# Patient Record
Sex: Female | Born: 1985
Health system: Southern US, Community
[De-identification: ages and names within clinical notes are randomized; demographics above are authoritative.]

## PROBLEM LIST (undated history)

## (undated) DIAGNOSIS — J302 Other seasonal allergic rhinitis: Secondary | ICD-10-CM

## (undated) DIAGNOSIS — J45909 Unspecified asthma, uncomplicated: Secondary | ICD-10-CM

## (undated) DIAGNOSIS — I10 Essential (primary) hypertension: Secondary | ICD-10-CM

## (undated) DIAGNOSIS — N390 Urinary tract infection, site not specified: Secondary | ICD-10-CM

## (undated) HISTORY — PX: TUBAL LIGATION: SHX77

---
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
## 2003-08-12 ENCOUNTER — Inpatient Hospital Stay (HOSPITAL_COMMUNITY): Admission: AD | Admit: 2003-08-12 | Discharge: 2003-08-13 | Payer: Self-pay | Admitting: *Deleted

## 2003-12-07 ENCOUNTER — Emergency Department (HOSPITAL_COMMUNITY): Admission: EM | Admit: 2003-12-07 | Discharge: 2003-12-07 | Payer: Self-pay

## 2004-07-22 ENCOUNTER — Inpatient Hospital Stay (HOSPITAL_COMMUNITY): Admission: EM | Admit: 2004-07-22 | Discharge: 2004-07-26 | Payer: Self-pay | Admitting: *Deleted

## 2004-10-11 ENCOUNTER — Ambulatory Visit (HOSPITAL_COMMUNITY): Admission: RE | Admit: 2004-10-11 | Discharge: 2004-10-11 | Payer: Self-pay | Admitting: *Deleted

## 2004-10-31 ENCOUNTER — Ambulatory Visit (HOSPITAL_COMMUNITY): Admission: RE | Admit: 2004-10-31 | Discharge: 2004-10-31 | Payer: Self-pay | Admitting: *Deleted

## 2004-11-01 ENCOUNTER — Ambulatory Visit (HOSPITAL_COMMUNITY): Admission: AD | Admit: 2004-11-01 | Discharge: 2004-11-01 | Payer: Self-pay | Admitting: *Deleted

## 2004-11-14 ENCOUNTER — Inpatient Hospital Stay (HOSPITAL_COMMUNITY): Admission: RE | Admit: 2004-11-14 | Discharge: 2004-11-17 | Payer: Self-pay | Admitting: *Deleted

## 2005-06-23 IMAGING — CR DG CHEST 1V
1 series · 1 of 1 positions shown · non-contrast
Comparison: none

CLINICAL DATA: Abdominal pain and rectal bleeding.  
 CHEST ? ONE VIEW:
 Heart size and vascularity are normal and the lungs are clear.  There is a mild thoracolumbar scoliosis.

[view not recorded]
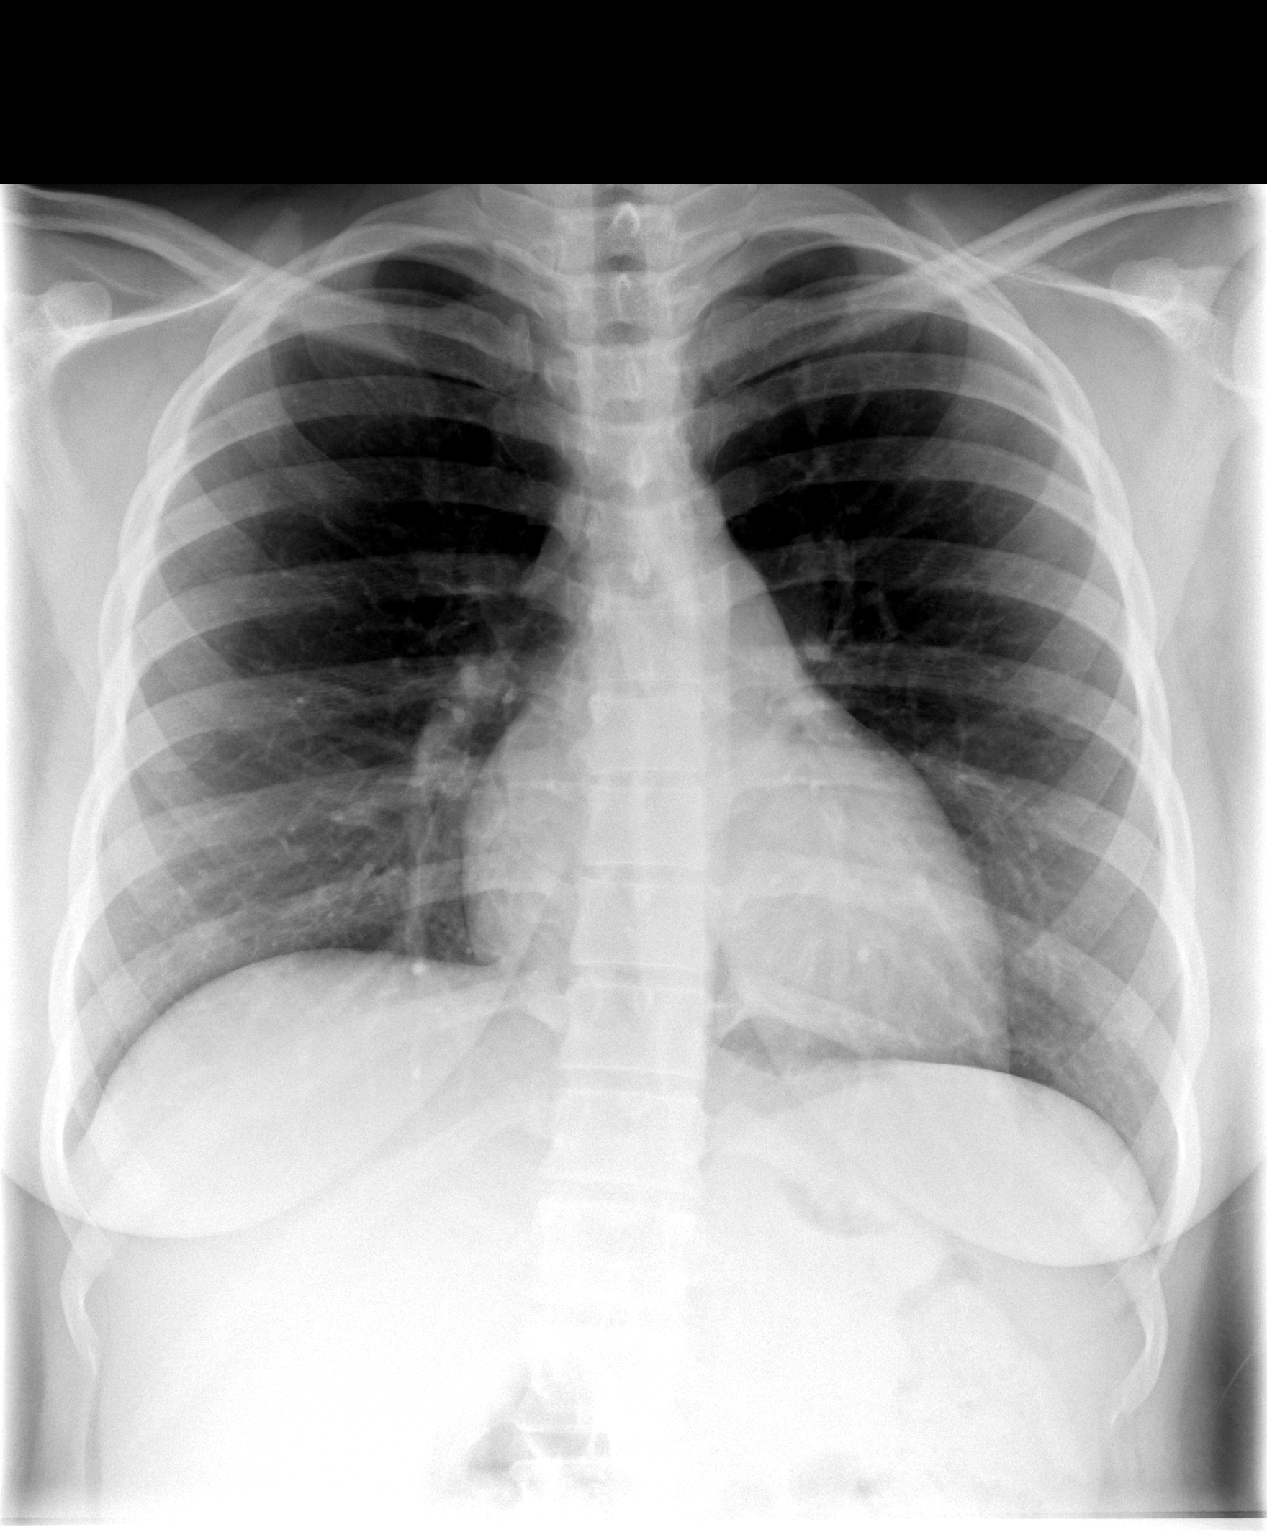

[1 of 1 positions shown; findings below may reference images not displayed]

IMPRESSION: No significant abnormality.

## 2005-06-24 IMAGING — US US OB COMP +14 WK
1 series · 14 of 28 positions shown · non-contrast
Comparison: none

CLINICAL DATA: Secondary syphilis date pregnancy. 
 OBSTETRICAL ULTRASOUND > 14 WEEKS:
 There is a single living intrauterine pregnancy with heart rate of 153 beats per minute and fetal movement. There is cephalic presentation at this time. The placenta is anterior and grade I with no previa. Amniotic fluid volume is normal.  The EPD if 5.6 or 23 weeks, 2 days.  Head circumference is 20.8 or 23 weeks, 0 days.    Abdominal circumference is 17.2 or 23 weeks, 1 day. Femur length is 3.9 or 22 weeks, 4 days. Mean gestational age is 22 weeks, five days.  Ratios are within normal limits.  The fetal intracranial contents, nuchal region, spine, four chamber heart, left side of the stomach, three vessel cord, anterior abdominal wall, both kidneys, bladder, four extremities are noted and appear normal.  The fetus is a male. The cervix is 4 cm.

[Series 1: unknown · 14 of 46 slices shown]
[im 2/46]
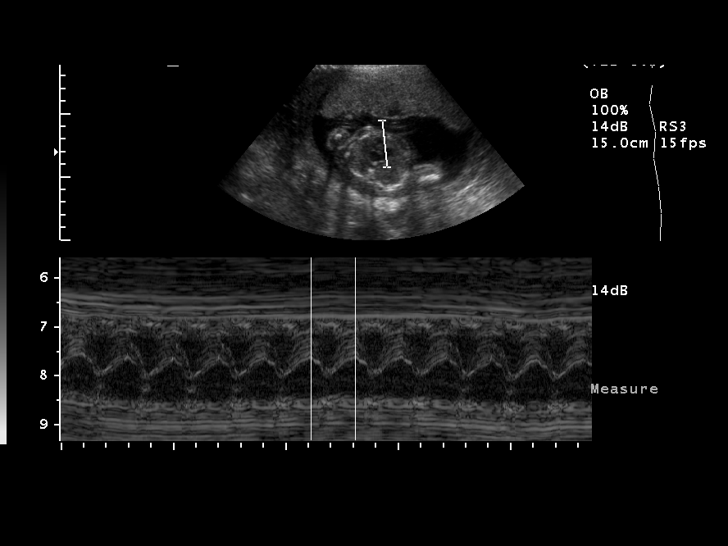
[im 6/46]
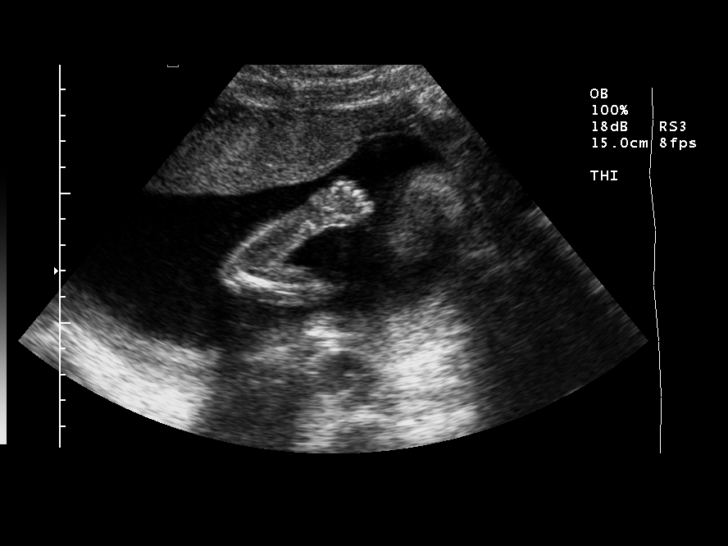
[im 9/46]
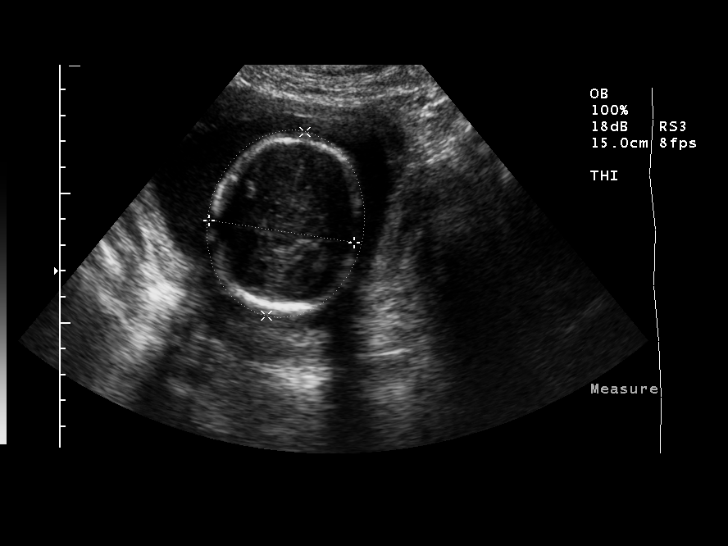
[im 12/46]
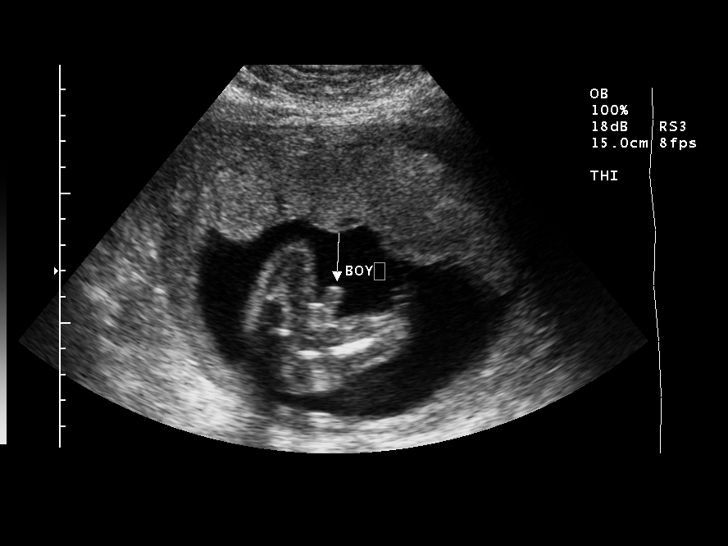
[im 16/46]
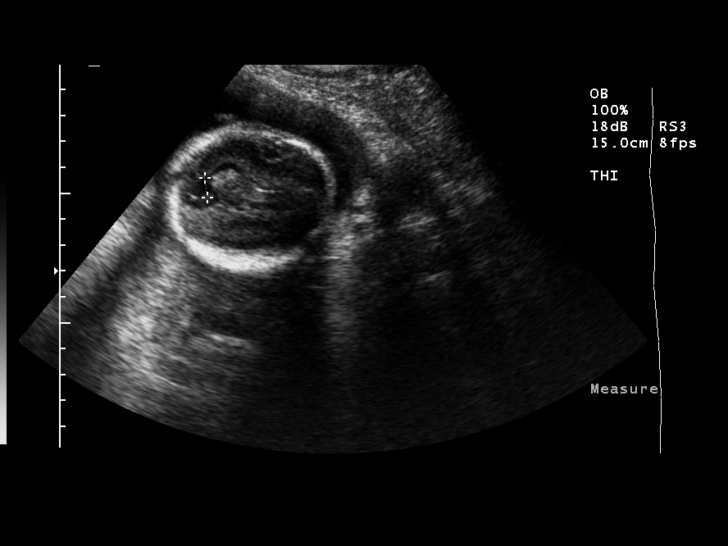
[im 19/46]
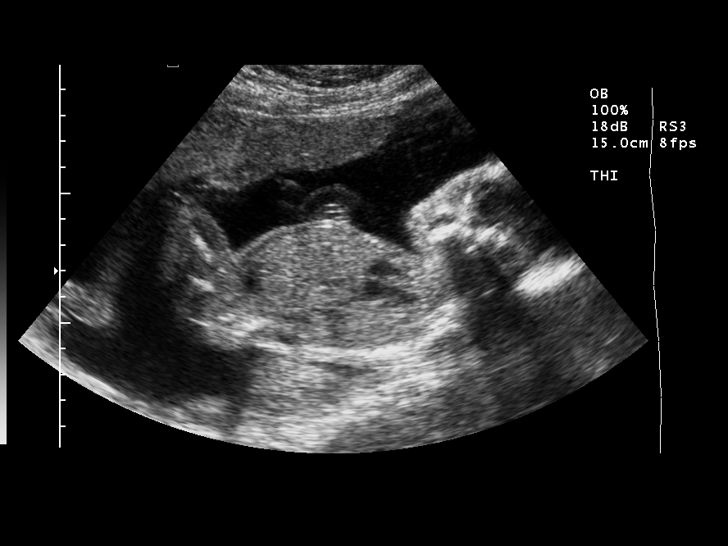
[im 22/46]
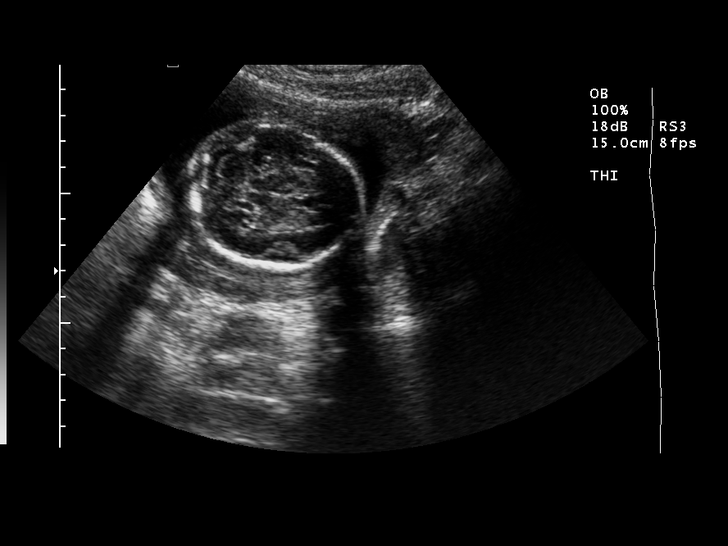
[im 26/46]
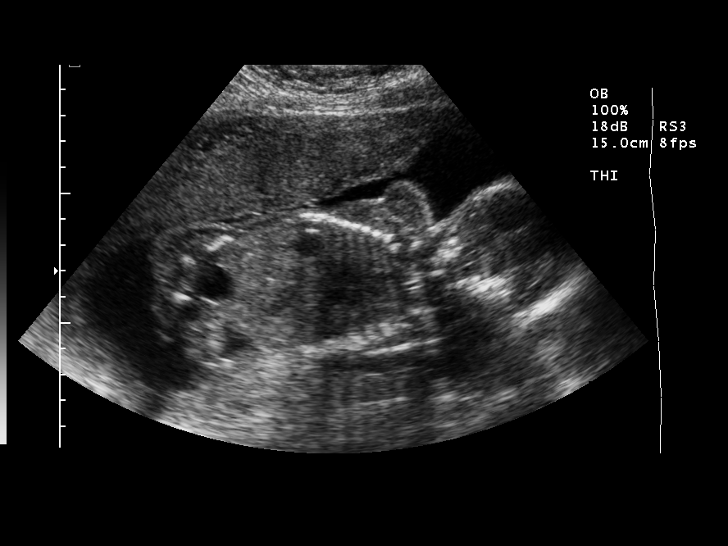
[im 29/46]
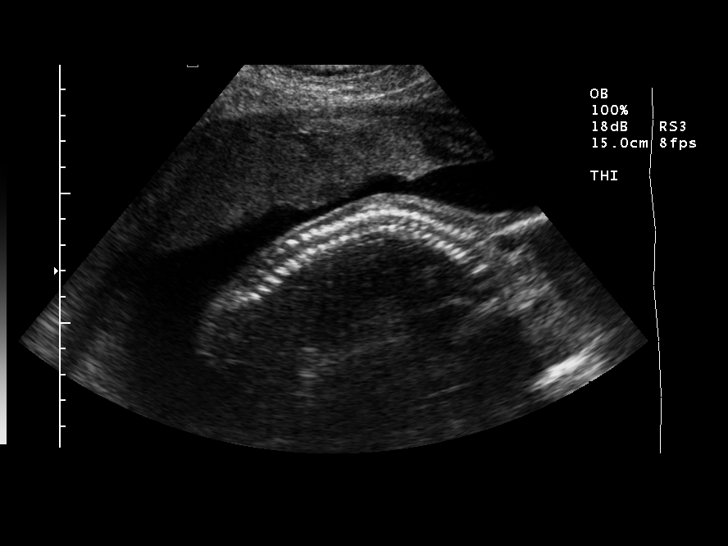
[im 32/46]
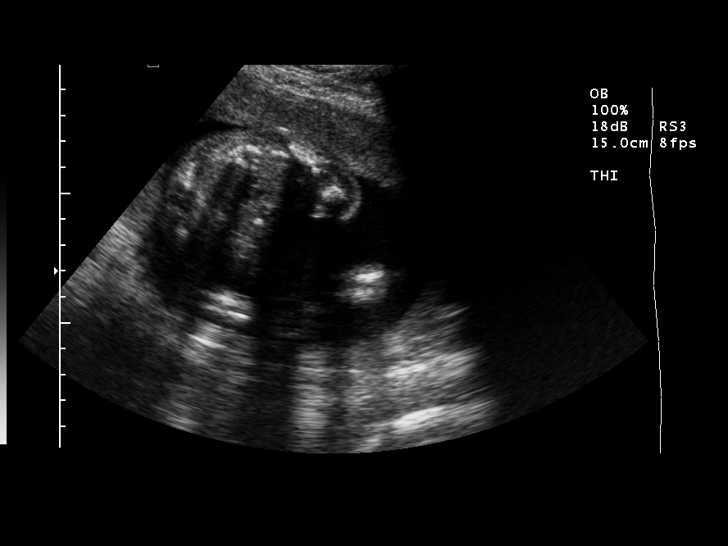
[im 36/46]
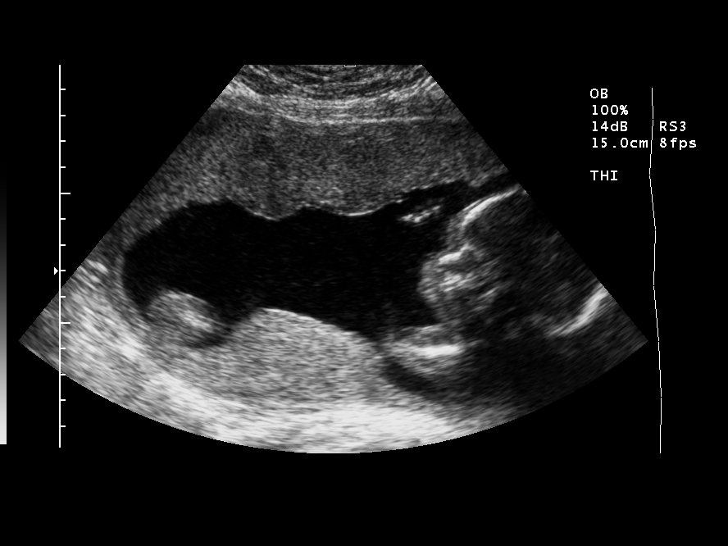
[im 39/46]
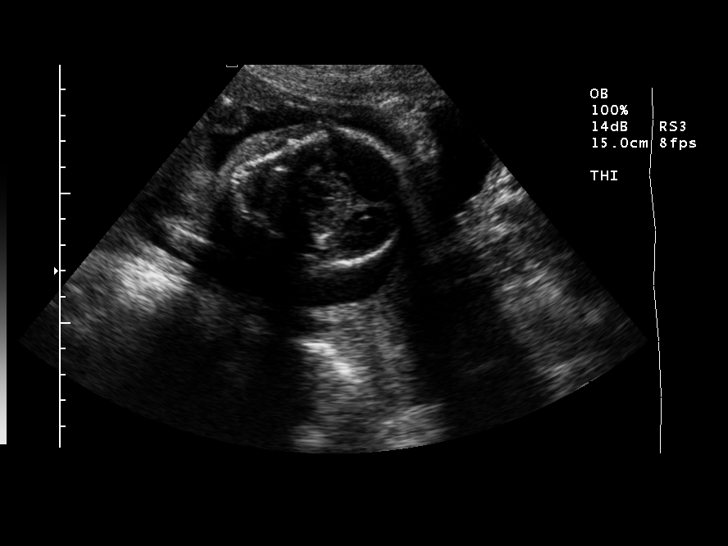
[im 42/46]
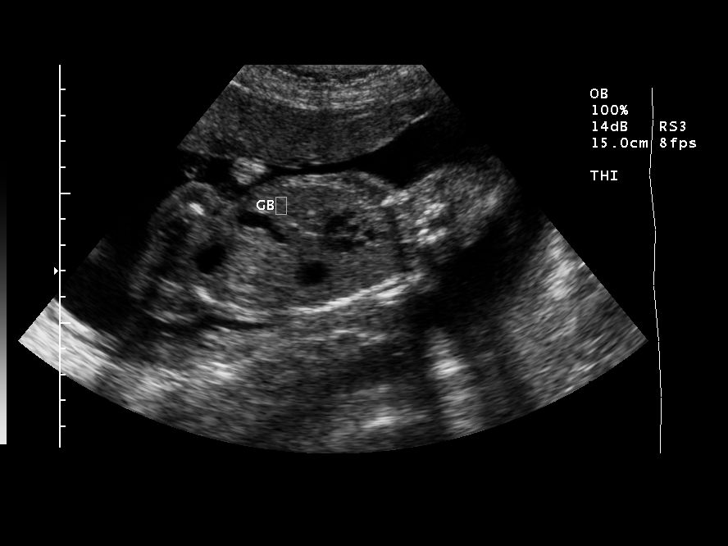
[im 46/46]
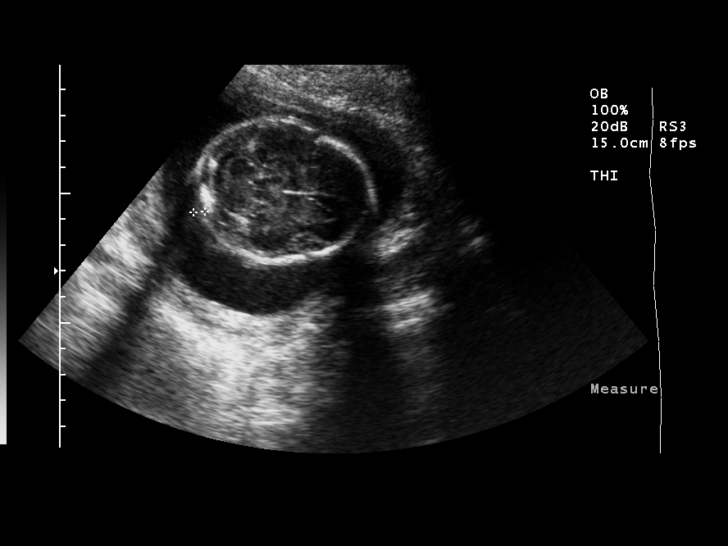

[14 of 28 positions shown; findings below may reference images not displayed]

IMPRESSION: Single living intrauterine pregnancy appearing normal at 22 weeks, 5 days.

## 2008-12-13 ENCOUNTER — Emergency Department (HOSPITAL_COMMUNITY): Admission: EM | Admit: 2008-12-13 | Discharge: 2008-12-13 | Payer: Self-pay | Admitting: Emergency Medicine

## 2009-07-03 ENCOUNTER — Other Ambulatory Visit: Payer: Self-pay | Admitting: Emergency Medicine

## 2009-07-03 ENCOUNTER — Other Ambulatory Visit: Payer: Self-pay | Admitting: Obstetrics and Gynecology

## 2009-07-03 ENCOUNTER — Inpatient Hospital Stay (HOSPITAL_COMMUNITY): Admission: AD | Admit: 2009-07-03 | Discharge: 2009-07-03 | Payer: Self-pay | Admitting: Obstetrics and Gynecology

## 2009-07-15 ENCOUNTER — Ambulatory Visit: Payer: Self-pay | Admitting: Obstetrics and Gynecology

## 2009-07-15 ENCOUNTER — Encounter: Payer: Self-pay | Admitting: Obstetrics and Gynecology

## 2009-07-15 LAB — CONVERTED CEMR LAB
Amphetamine Screen, Ur: NEGATIVE
Antibody Screen: NEGATIVE
Barbiturate Quant, Ur: NEGATIVE
Benzodiazepines.: NEGATIVE
Cocaine Metabolites: NEGATIVE
Creatinine,U: 138.9 mg/dL
Marijuana Metabolite: NEGATIVE
Methadone: NEGATIVE
Opiate Screen, Urine: NEGATIVE
Phencyclidine (PCP): NEGATIVE
Propoxyphene: NEGATIVE

## 2009-07-29 ENCOUNTER — Ambulatory Visit: Payer: Self-pay | Admitting: Obstetrics and Gynecology

## 2009-08-05 ENCOUNTER — Inpatient Hospital Stay (HOSPITAL_COMMUNITY): Admission: AD | Admit: 2009-08-05 | Discharge: 2009-08-07 | Payer: Self-pay | Admitting: Obstetrics & Gynecology

## 2009-08-05 ENCOUNTER — Ambulatory Visit: Payer: Self-pay | Admitting: Family

## 2009-08-05 ENCOUNTER — Encounter: Payer: Self-pay | Admitting: Obstetrics & Gynecology

## 2009-10-08 ENCOUNTER — Ambulatory Visit: Payer: Self-pay | Admitting: Obstetrics and Gynecology

## 2009-11-02 ENCOUNTER — Ambulatory Visit (HOSPITAL_COMMUNITY): Admission: RE | Admit: 2009-11-02 | Discharge: 2009-11-02 | Payer: Self-pay | Admitting: Obstetrics and Gynecology

## 2009-11-02 ENCOUNTER — Ambulatory Visit: Payer: Self-pay | Admitting: Obstetrics and Gynecology

## 2010-06-04 IMAGING — US US OB COMP +14 WK
1 series · 14 of 28 positions shown · non-contrast
Comparison: none

OBSTETRICAL ULTRASOUND:
 This ultrasound exam was performed in the [HOSPITAL] Ultrasound Department.  The OB US report was generated in the AS system, and faxed to the ordering physician.  This report is also available in [HOSPITAL]?s [employer] and in [REDACTED] PACS.

[Series 1: us ob comp +14 wk · 32 acquisitions, 14 frames shown]
[im 2/32]
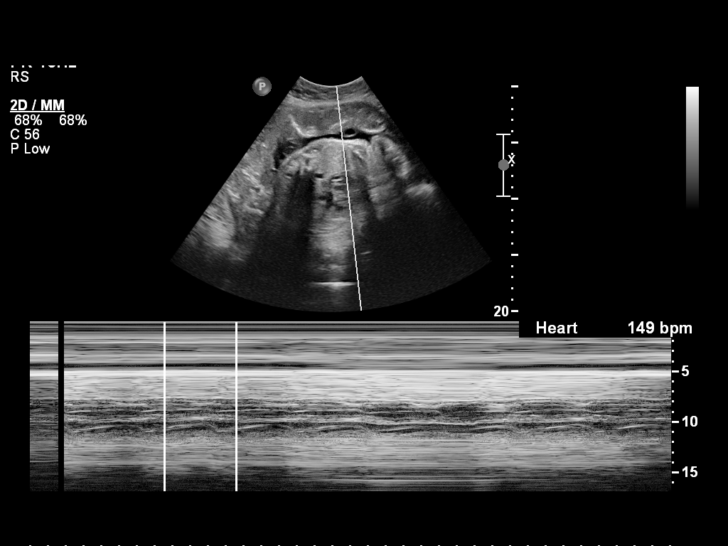
[im 4/32]
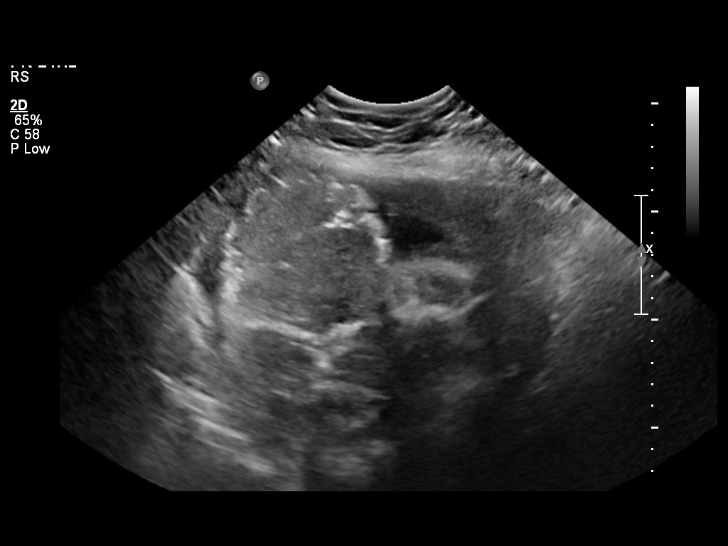
[im 6/32]
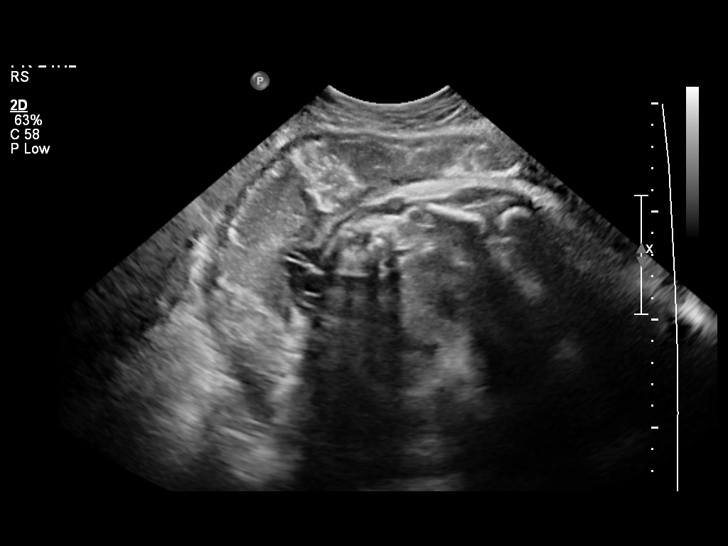
[im 9/32]
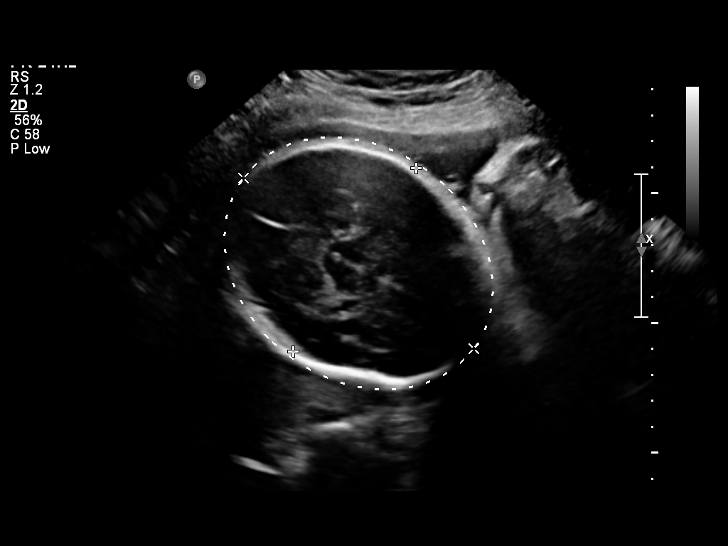
[im 11/32]
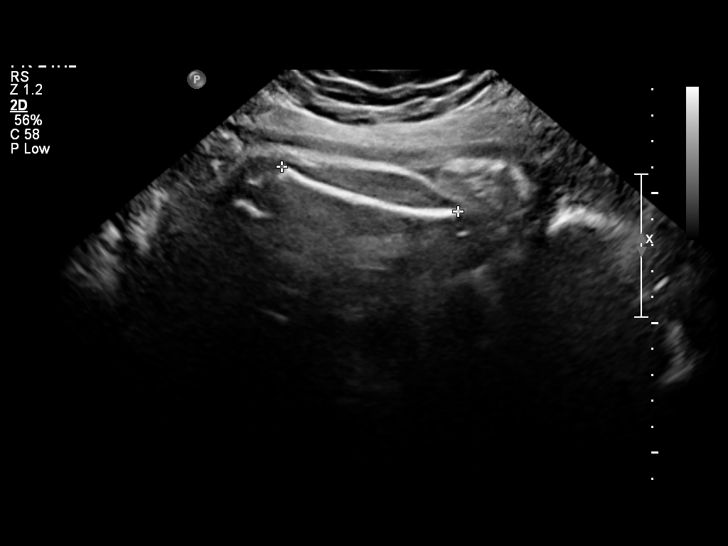
[im 13/32]
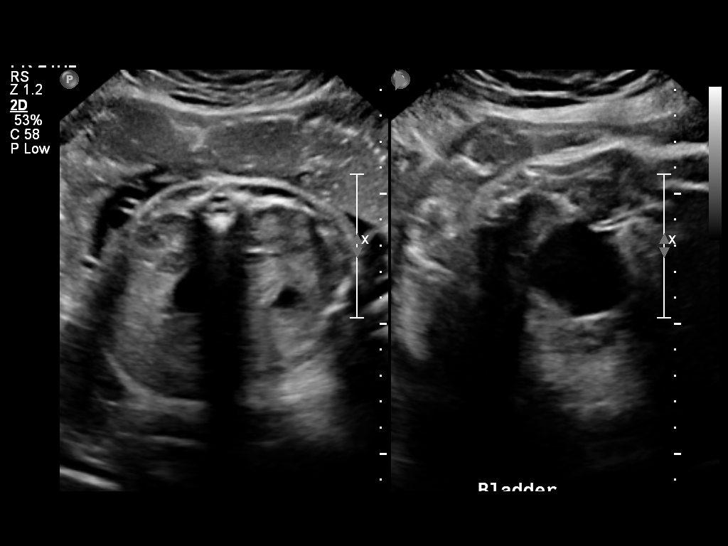
[im 15/32]
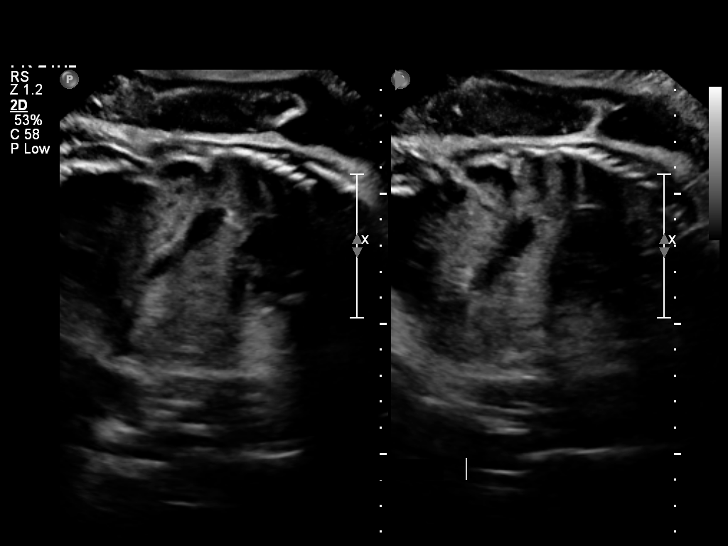
[im 18/32]
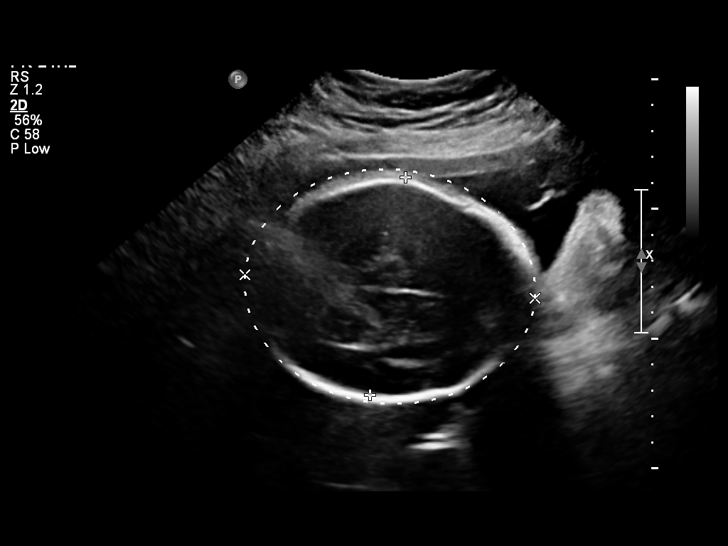
[im 20/32]
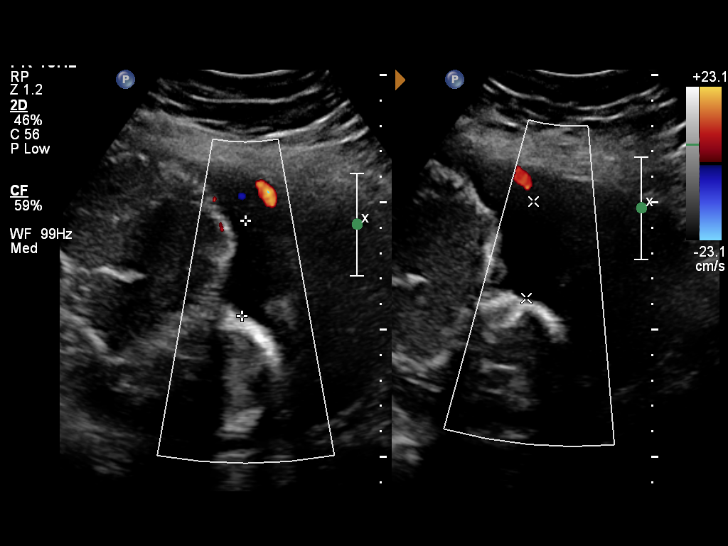
[im 22/32]
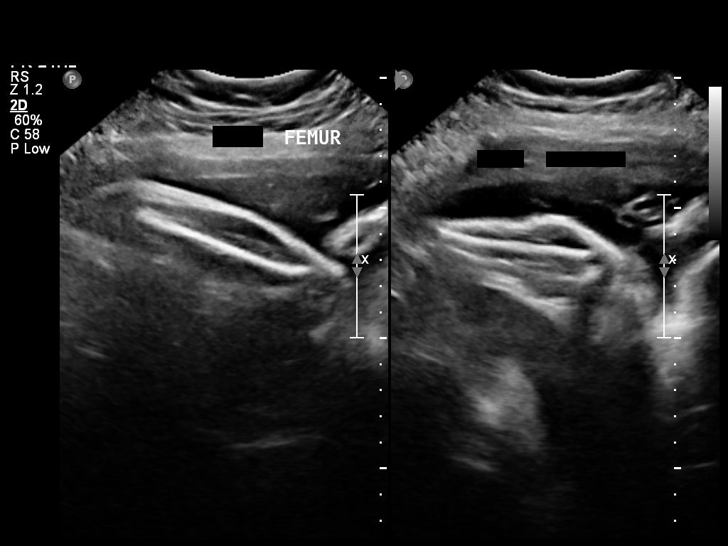
[im 25/32]
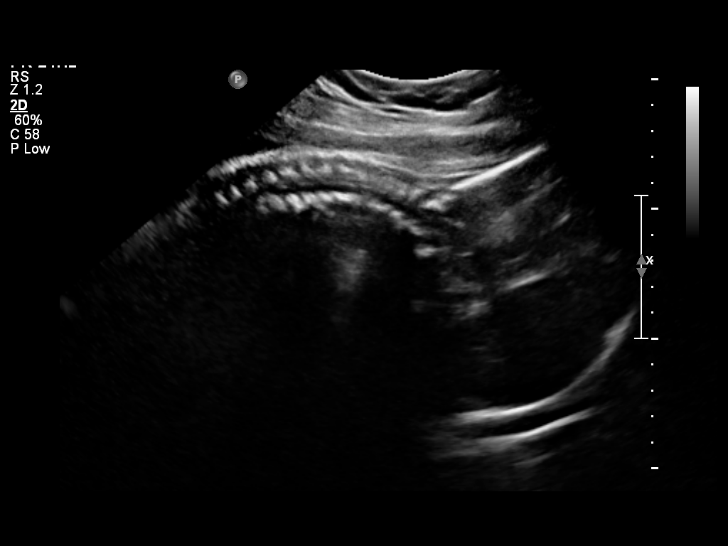
[im 27/32]
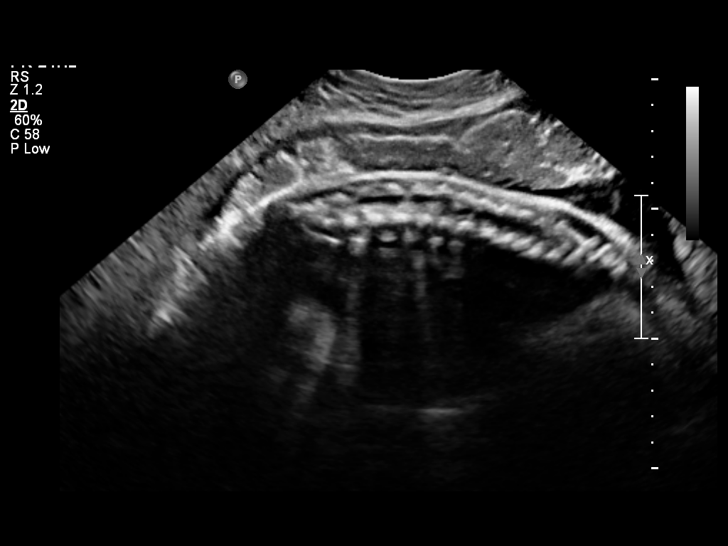
[im 29/32]
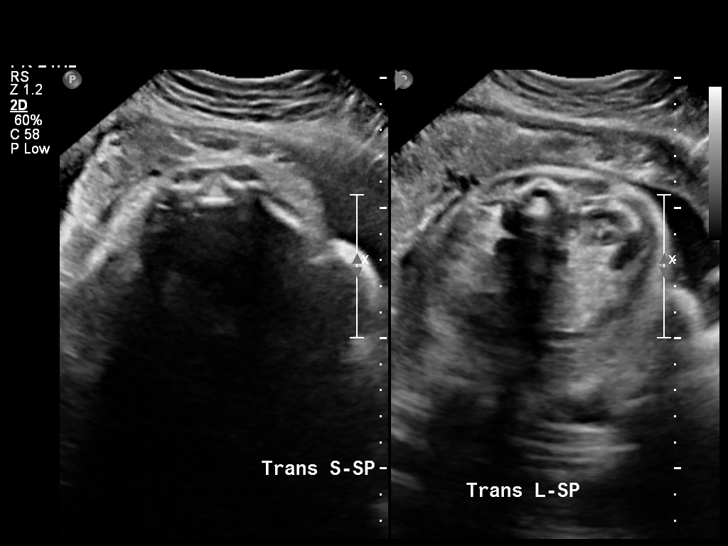
[im 32/32]
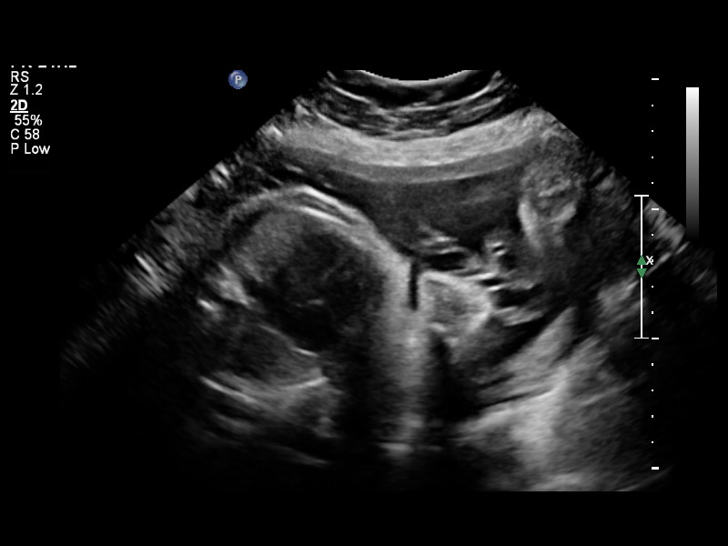

[14 of 28 positions shown; findings below may reference images not displayed]

IMPRESSION: See [facility] Obstetric US report.

## 2010-09-26 LAB — POCT URINALYSIS DIP (DEVICE)
Glucose, UA: NEGATIVE mg/dL
Ketones, ur: NEGATIVE mg/dL
Nitrite: NEGATIVE
Protein, ur: NEGATIVE mg/dL
Protein, ur: NEGATIVE mg/dL
Specific Gravity, Urine: 1.015 (ref 1.005–1.030)
Specific Gravity, Urine: 1.01 (ref 1.005–1.030)
Urobilinogen, UA: 1 mg/dL (ref 0.0–1.0)
Urobilinogen, UA: 1 mg/dL (ref 0.0–1.0)
pH: 6 (ref 5.0–8.0)

## 2010-09-26 LAB — CBC
HCT: 35.5 % — ABNORMAL LOW (ref 36.0–46.0)
Hemoglobin: 11.8 g/dL — ABNORMAL LOW (ref 12.0–15.0)
MCHC: 33.2 g/dL (ref 30.0–36.0)
MCV: 84.7 fL (ref 78.0–100.0)
RBC: 4.19 MIL/uL (ref 3.87–5.11)
RDW: 15.5 % (ref 11.5–15.5)

## 2010-09-26 LAB — URINALYSIS, ROUTINE W REFLEX MICROSCOPIC
Bilirubin Urine: NEGATIVE
Hgb urine dipstick: NEGATIVE
Ketones, ur: 15 mg/dL — AB
Nitrite: NEGATIVE
Urobilinogen, UA: 1 mg/dL (ref 0.0–1.0)
pH: 6 (ref 5.0–8.0)

## 2010-09-26 LAB — URIC ACID: Uric Acid, Serum: 4.8 mg/dL (ref 2.4–7.0)

## 2010-09-26 LAB — COMPREHENSIVE METABOLIC PANEL
ALT: 11 U/L (ref 0–35)
AST: 19 U/L (ref 0–37)
Alkaline Phosphatase: 181 U/L — ABNORMAL HIGH (ref 39–117)
CO2: 23 mEq/L (ref 19–32)
GFR calc Af Amer: >60 mL/min (ref >60)
GFR calc non Af Amer: >60 mL/min (ref >60)
Glucose, Bld: 79 mg/dL (ref 70–99)
Potassium: 3.5 mEq/L (ref 3.5–5.1)
Sodium: 130 mEq/L — ABNORMAL LOW (ref 135–145)

## 2010-09-26 LAB — GLUCOSE, CAPILLARY
Glucose-Capillary: 72 mg/dL (ref 70–99)
Glucose-Capillary: 81 mg/dL (ref 70–99)
Glucose-Capillary: 83 mg/dL (ref 70–99)

## 2010-09-28 LAB — CBC
HCT: 42.2 % (ref 36.0–46.0)
Hemoglobin: 13.9 g/dL (ref 12.0–15.0)
RBC: 4.95 MIL/uL (ref 3.87–5.11)
RDW: 14.5 % (ref 11.5–15.5)

## 2010-10-11 LAB — URINALYSIS, ROUTINE W REFLEX MICROSCOPIC
Bilirubin Urine: NEGATIVE
Hgb urine dipstick: NEGATIVE
Nitrite: NEGATIVE
Protein, ur: NEGATIVE mg/dL
Specific Gravity, Urine: 1.024 (ref 1.005–1.030)
Urobilinogen, UA: 1 mg/dL (ref 0.0–1.0)

## 2010-10-11 LAB — GC/CHLAMYDIA PROBE AMP, URINE
Chlamydia, Swab/Urine, PCR: NEGATIVE
GC Probe Amp, Urine: NEGATIVE

## 2010-10-11 LAB — WET PREP, GENITAL
Trich, Wet Prep: NONE SEEN
Yeast Wet Prep HPF POC: NONE SEEN

## 2010-10-11 LAB — CBC
HCT: 32.6 % — ABNORMAL LOW (ref 36.0–46.0)
HCT: 33.9 % — ABNORMAL LOW (ref 36.0–46.0)
Hemoglobin: 10.9 g/dL — ABNORMAL LOW (ref 12.0–15.0)
Hemoglobin: 11.4 g/dL — ABNORMAL LOW (ref 12.0–15.0)
MCHC: 33.3 g/dL (ref 30.0–36.0)
MCV: 84.8 fL (ref 78.0–100.0)
RBC: 3.85 MIL/uL — ABNORMAL LOW (ref 3.87–5.11)
RBC: 4.02 MIL/uL (ref 3.87–5.11)
WBC: 9.1 10*3/uL (ref 4.0–10.5)

## 2010-10-11 LAB — COMPREHENSIVE METABOLIC PANEL
ALT: 10 U/L (ref 0–35)
AST: 15 U/L (ref 0–37)
Albumin: 2.6 g/dL — ABNORMAL LOW (ref 3.5–5.2)
Alkaline Phosphatase: 168 U/L — ABNORMAL HIGH (ref 39–117)
BUN: 5 mg/dL — ABNORMAL LOW (ref 6–23)
BUN: 5 mg/dL — ABNORMAL LOW (ref 6–23)
CO2: 22 mEq/L (ref 19–32)
Calcium: 8.9 mg/dL (ref 8.4–10.5)
Chloride: 107 mEq/L (ref 96–112)
Chloride: 107 mEq/L (ref 96–112)
Creatinine, Ser: 0.54 mg/dL (ref 0.4–1.2)
GFR calc Af Amer: >60 mL/min (ref >60)
GFR calc non Af Amer: >60 mL/min (ref >60)
Glucose, Bld: 94 mg/dL (ref 70–99)
Potassium: 4 mEq/L (ref 3.5–5.1)
Sodium: 137 mEq/L (ref 135–145)
Total Bilirubin: 0.4 mg/dL (ref 0.3–1.2)
Total Bilirubin: 0.5 mg/dL (ref 0.3–1.2)
Total Protein: 6.6 g/dL (ref 6.0–8.3)

## 2010-10-11 LAB — URINE MICROSCOPIC-ADD ON

## 2010-10-11 LAB — DIFFERENTIAL
Basophils Absolute: 0 10*3/uL (ref 0.0–0.1)
Basophils Relative: 0 % (ref 0–1)
Basophils Relative: 0 % (ref 0–1)
Eosinophils Absolute: 0 10*3/uL (ref 0.0–0.7)
Eosinophils Absolute: 0 10*3/uL (ref 0.0–0.7)
Lymphs Abs: 2.7 10*3/uL (ref 0.7–4.0)
Monocytes Absolute: 0.5 10*3/uL (ref 0.1–1.0)
Monocytes Relative: 5 % (ref 3–12)
Monocytes Relative: 4 % (ref 3–12)
Neutrophils Relative %: 65 % (ref 43–77)
Neutrophils Relative %: 67 % (ref 43–77)

## 2010-10-11 LAB — T.PALLIDUM AB, IGG: T pallidum Antibodies (TP-PA): 44.6 IV — ABNORMAL HIGH (ref <1.0)

## 2010-10-11 LAB — RPR: RPR Ser Ql: REACTIVE — AB

## 2010-10-11 LAB — PREGNANCY, URINE: Preg Test, Ur: POSITIVE

## 2010-10-11 LAB — ABO/RH: ABO/RH(D): B POS

## 2010-11-26 NOTE — Op Note (Signed)
NAMEFRANCIA, Sonya               ACCOUNT NO.:  [id number]   MEDICAL RECORD NO.:  [id number]          PATIENT TYPE:  INP   LOCATION:  A412                          FACILITY:  [facility]   PHYSICIAN:  Langley Gauss, MD     DATE OF BIRTH:  Jan 30, 1986   DATE OF PROCEDURE:  DATE OF DISCHARGE:                                 OPERATIVE REPORT   DIAGNOSES:  1.  Term pregnancy.  2.  history of secondary syphilis with positive RPR tighter during this      pregnancy, unknown duration OF illness, patient status post two      treatments with Bicillin.   DELIVERY PERFORMED:  Spontaneous assisted vaginal delivery, 7 pound 9 ounce  female infant, delivered over an intact perineum, delivery performed by Dr.  Roylene Reason. Lisette Grinder.   ESTIMATED BLOOD LOSS:  Less than 500 mL.   Findings at time of delivery include a nuchal cord x1 one without  compression.   SPECIMENS:  Arterial cord gas and cord blood are obtained from the umbilical  cord.  The placenta is noted to deliver apparently intact with a three-  vessel umbilical cord.  Normal maternal surface grossly in appearance.  The placenta is sent to pathology laboratory for permanent sections due to  history of maternal syphilis.   SUMMARY:  The patient presented Nov 13, 2004 in very early stages of labor.  Initial examination 3 cm dilated.  When the patient had made change to 5 cm,  amniotomy was formed with findings of clear amniotic fluid.  Subsequently the patient had a protracted active phase of labor.  She was  contracting every 1-1/2 to five minutes.  Fetal heart rate remained  reassuring.  The patient did not receive any Pitocin augmentation due to the  contraction frequency of 1-1/2 to five minutes.  The patient received IV  narcotics during the course of labor.  During three prior vaginal  deliveries, she had received only IV narcotics.  The patient had a strong  urge to push, at which time she was examined and noted be 9 cm dilated with  an  anterior lip.  Subsequently over the next several contractions the lip  disappeared until the patient was completely dilated.  The patient was  placed into dorsal lithotomy position, prepped and draped in usual sterile  manner.  She pushed very well during the short second stage of labor.  Delivery occurred in a direct OA position over an intact perineum.  The  mouth and nares were bulb-suctioned of clear amniotic fluid.  A nuchal cord  x1 is reduced.  Spontaneous rotation occurred to a left anterior shoulder  position.  Gentle downward traction combined with expulsive efforts resulted  in delivery of this anterior shoulder as well as the remainder the infant  without difficulty.  A spontaneous and vigorous breathe and cry is noted.  Umbilical cord is milked toward the infant.  Cord was doubly clamped and  cut.  Infant is taken to the nursery table for immediate assessment.  Arterial cord gas and cord blood were then obtained.  Gentle traction on  the  umbilical cord results in separation, which upon examination is noted be  intact placenta with associated three-vessel umbilical cord.  Examination of  the genital tract reveals intact perineum.  No vaginal or periurethral  lacerations.  Excellent uterine tone was achieved following  delivery with administration of IV Pitocin solution.  Both mother and infant  doing well following delivery.  Pertinent laboratory studies:  The patient,  as expected, did have a positive RPR with a titer of 1:4, this dated of Nov 13, 2004.      DC/MEDQ  D:  11/15/2004  T:  11/15/2004  Job:  [id number]

## 2010-11-26 NOTE — Discharge Summary (Signed)
NAMEREGINA, GANCI NO.:  [id number]   MEDICAL RECORD NO.:  [id number]          PATIENT TYPE:  INP   LOCATION:  A412                          FACILITY:  [facility]   PHYSICIAN:  Langley Gauss, MD     DATE OF BIRTH:  1986-03-05   DATE OF ADMISSION:  11/14/2004  DATE OF DISCHARGE:  05/10/2006LH                                 DISCHARGE SUMMARY   DISCHARGE INSTRUCTIONS:  The patient is getting a copy of the standardized  discharge instructions.   FOLLOW UP:  She is advised to follow up in the office in two-three weeks  time for a post partum check.  The patient desires permanent sterilization.  This has been discussed during the prenatal course and the sterilization  procedure can be discussed at that post partum visit.  In addition, the  patient's RPR titer can be repeated, as the information can likewise be  useful to the pediatrician staff.   PERTINENT LABORATORY STUDIES THIS HOSPITALIZATION:  Include an RPR titer of  1:4, whereas titer prior to treatment had been as high as 1:128.  I  discussed with Dr. Birder Robson and treatment for the baby was most pertinent  based upon a four fold reduction in the patient's pre and post treatment  titers.  The patient is most adamantly certain of guarding her desire for  permanent sterilization and the patient is advised that this can be  scheduled when appropriate.   PROCEDURES PERFORMED:   ADMISSION HISTORY AND PHYSICAL:  On Nov 14, 2004 spontaneous vaginal delivery  a 7 pound 9 ounce female infant delivered over an intact perineum.  There is  noted to be group B Streptococcus carrier status.   DATE OF DISCHARGE:  Nov 17, 2004.   PERTINENT LABORATORY STUDIES:  Include a hemoglobin of 11.4, hematocrit 32.4  with a white count of 7.8.  On postpartum day #1 hemoglobin 11.2, hematocrit  32.8, with a white count of 11.6.  RPR titer as stated previously is 1:4.  B  positive blood type.   HOSPITAL COURSE:  See previous  dictations.  The patient presented to [facility] the a.m. of Nov 14, 2004 after appropriate observation.  She  was noted to have irregular uterine contractions but significant cervical  change.  She was admitted due to unknown group B Streptococcus carrier  status.  She was treated during the course of the labor with IV ampicillin  in an effort to prevent vertical transmission to the infant.  The patient  was noted to be 5 cm dilated, at which time the amniotomy was performed with  findings of clear amniotic fluid.  A fetal scalp electrode was placed.  The  patient received only IV narcotics during the course of labor.  She did  progress slowly along the active phase of labor.  She did, in fact, have a  protracted active phase of labor, but upon reaching complete dilatation had  a short second stage of labor to an uncomplicated vaginal delivery.  Postpartum the patient did well. She bottle fed.  She bonded well with the  infant.  She did have some family members with good family support.  The  hospitalization was extended postpartum due to the unknown group B  Streptococcus carrier status with the date of discharge on Nov 17, 2004.  Both Dr. Milinda Cave and Dr. Birder Robson were involved in the patient's baby's  postpartum care.  Pertinently a female infant was delivered.  I discussed  circumcision with the patient.  She did not desire infant circumcision and  this was predominantly due to the request of the patient's father, who  desired circumcision not be performed.      DC/MEDQ  D:  11/18/2004  T:  11/18/2004  Job:  [id number]   cc:   Birder Robson, M.D.

## 2010-11-26 NOTE — Discharge Summary (Signed)
NAMESHARIFA, [name]               ACCOUNT NO.:  [id number]   MEDICAL RECORD NO.:  [id number]          PATIENT TYPE:  INP   LOCATION:  A425                          FACILITY:  [facility]   PHYSICIAN:  Langley Gauss, MD     DATE OF BIRTH:  February 05, 1986   DATE OF ADMISSION:  07/22/2004  DATE OF DISCHARGE:  01/16/2006LH                                 DISCHARGE SUMMARY   DIAGNOSES:  1.  A 22-5/7 weeks intrauterine pregnancy.  2.  Secondary syphilis with condylomata.  3.  Insufficient prenatal care presenting as an OB unassigned at [redacted] weeks      gestation.  4.  High risk sexual behavior.   DISPOSITION:  At time of discharge patient is advised to follow up at the  [facility] Department dated July 29, 2004 at which time a  second series of 2.4 million units of benzathine penicillin G can be  administered.  Subsequently, she needs to select an OB care [facility] Andrades in the  area for return office visit August 09, 2004.  At that time GC and  Chlamydia cultures can be performed from the cervix as the sterile speculum  examination would be possible at that time.   LABORATORIES:  OB ultrasound obtained in the department of radiology reveals  a single intrauterine pregnancy, cervix 4 cm long, female fetus.  The overall  gestational age 49-5/[redacted] weeks gestation.  All parameters identified are upper  portion at size.  No anatomic abnormalities are identified.  Most  specifically, intracranial contents, nuchal region, spine, four chamber view  of the heart, left side of the stomach, three vessel cord, anterior  abdominal wall, both kidneys, bladder, and four extremities are noted and  appear normal.  No hepatosplenomegaly is noted.  The amniotic fluid volume  is normal.  The placenta is anterior and grade I with no placenta previa.  Fetal movement was identified.  Other laboratory studies include hemoglobin  10.7, hematocrit 31.4 with a white count of 6.6, platelet count 278,000.  PT  and PTT  within normal limits.  Electrolytes within normal limits.  Liver  function tests reveal minimally elevated AST at 45, ALT slightly elevated at  71, alkaline phosphatase elevated as a result of the pregnancy itself at  202.  Hepatitis B surface antigen is negative.  HIV is nonreactive.  Urine  pregnancy test is positive with a quantitative of 29,194.  Urinalysis is  pertinent for moderate hemoglobin, small leukocyte esterase.  However, final  urine culture is pertinent for no uropathogens identified.  RPR is positive  with a titer of 1:128 identified.  This is confirmed with a TPPA test.  Herpes simplex I and II IgM which would be most likely indicative of current  section is in the equivocal or questionable range.  The IgG antibody is  noted to be high positive which may indicate a current or past HSV  infection; however, in light of the clinical picture this most likely  represents prior history of a previous HSV I or II.   HOSPITAL COURSE:  Patient initially presented July 22, 2004 as  an OB  unassigned at which time admission evaluation is performed.  Patient is on  contact precautions, thus subsequent hospital care provided on July 23, 2004, July 24, 2004, July 25, 2004 entailed greater than 30 minutes'  duration on the floor and in contact with the patient due to the contact  precautions, discussion of patient's infectivity with the involved nursing  staff, and discussions with Corrie Dandy __________, infectious disease chairperson  at this hospital.  Again, then on July 26, 2004 or date of discharge,  again greater than 30 minutes' duration is involved in discharging patient  to home as well as giving extensive discharge instructions to the patient  for follow-up.  She is advised that typically a single 2.4 million unit  treatment would be required but with the pregnancy a second 2.4 million unit  series would be required in one week's time.  Review of the ultrasonographic   findings with the patient at this time which reveal a normal-appearing  fetus.  However, she is advised that transmission and involvement of the  fetus can occur with any stage of the disease and at any stage of the  pregnancy.  Thus, close clinical follow-up with serial ultrasounds will be  required as well as serial performance of RPR titers.  Patient does describe  good fetal movement at time of discharge, marked decreased pain in the  perineal lesion and area.  The lesions appear to be shrinking in size, still  somewhat firm in their consistency.  These are not fluid filled lesions,  thus no rupture has occurred.  Patient will be discharged to be staying with  the family here in the [place] area.     Dona   DC/MEDQ  D:  07/26/2004  T:  07/26/2004  Job:  [id number]

## 2010-11-26 NOTE — H&P (Signed)
NAMEJAYLNN, ULLERY NO.:  [id number]   MEDICAL RECORD NO.:  [id number]          PATIENT TYPE:  INP   LOCATION:  A425                          FACILITY:  [facility]   PHYSICIAN:  Langley Gauss, MD     DATE OF BIRTH:  [birth year]   DATE OF ADMISSION:  07/22/2004  DATE OF DISCHARGE:  LH                                HISTORY & PHYSICAL   HISTORY OF PRESENT ILLNESS:  The patient is a 25 year old multiparous  patient who presented to [facility] as an OB unassigned, actually  coming in by EMS.  She is a gravida 4, para 3, with a last normal menstrual  period of January 13, 2004.  Her chief complaint is that of abdominal pain and  rectal bleeding.  The patient by her report presumably did not realize that  she was pregnant.  The patient states that she has had about a two day  history of abdominal cramping as well as dark bowel movements and red rectal  bleeding.  She also states that she has a rash about one week previously,  she had a bright red somewhat scaling rash on the palmar surfaces of her  hands which subsequently spread and over the past 48 hours prior to  admission began having bullous-appearing dark, painful vesicles in the groin  area.  The patient is from the [place] area, has also been from [place] and has just recently returned from a visit to [place].  The  patient is initially evaluated by Dr. Mosetta Putt in the emergency room, given the  presumptive diagnosis of syphilis as well as herpes simplex.  She was  treated with 500 mg of intravenous acyclovir as well as 2.5 million units of  benzathine Penicillin G.  Subsequently I was contacted as the OB unassigned  doctor on call to assume complete management of this patient for which I  have no prior physician-patient relationship.   PAST MEDICAL HISTORY:  The patient's past medical history is pertinent for  prior treatment for condyloma.  She has three prior vaginal deliveries.  She  denies any  other preceding history of any sexually transmitted diseases,  denies any other medical or surgical history other than those associated  with childbirth.   ALLERGIES:  Patient states she has no known drug allergies.   REVIEW OF SYMPTOMS:  Pertinent for the painful lesions in the groin area.  She denies any history of open sores. Denies any change in vaginal  discharge.  She, of course, has been sexually active without any protection  against sexually transmitted diseases.   PHYSICAL EXAMINATION:  GENERAL:  She is noted to be a black female.  She is  alert and oriented, however, there is noted to be a very foul odor present  in her vicinity.  The patient appropriately has been placed on the third  floor and is on strict contact precautions.  VITAL SIGNS:  Temperature 98.2.  Heart rate 95. Respiratory rate is 18.  Blood pressure is 121/79.  HEENT:  Negative, no adenopathy.  Lips are chapped patient states  from  chewing on her lips.  There are no oral lesions identified.  NECK:  Supple.  Thyroid is not palpable.  ABDOMEN:  Soft, nontender, obvious gravid uterus identified with the fundal  height just above the umbilicus.  PELVIC:  Examination is not performed due to the extremely foul odor  present, no vaginal bleeding, leakage of fluid or purulence is identified,  confluent across the anterior visible groin region are bullous solid-  appearing lesions each about 1 to 2 cm in size with very firm consistency  and very well defined raised borders.   ASSESSMENT/PLAN:  The patient's red scaly lesions on the hand as well as the  lesions in the groin area are most typical for secondary syphilis with  condylomata lesions in the groin area.  The patient at this point in time  has been treated appropriately per Dr. Mosetta Putt with the Benzathine Penicillin  G.  It is indeterminate whether any gonorrhea or Chlamydia may also be  existing due to the extreme sensitivity of the lesions and inability to   perform a sterile speculum examination.  There is no evidence of any  ulcerative lesions or herpes also coinciding.   The patient is placed at this time on strict contact precautions.  Additional studies are performed to assess for any co-existent sexually  transmitted diseases, most pertinently human immunodeficiency virus,  hepatitis B.  An RPR titer is currently pending.  Have also ordered herpes  simplex titer to consist of IgG, IgM antibodies.   Due to the quite infectious nature of this patient, she is on strict contact  precautions at this time.     Dona   DC/MEDQ  D:  07/26/2004  T:  07/26/2004  Job:  [id number]

## 2010-11-26 NOTE — H&P (Signed)
NAMELABREA, ECCLESTON NO.:  [id number]   MEDICAL RECORD NO.:  [id number]          PATIENT TYPE:  INP   LOCATION:  LDR1                          FACILITY:  [facility]   PHYSICIAN:  Langley Gauss, MD     DATE OF BIRTH:  [birth year]   DATE OF ADMISSION:  11/14/2004  DATE OF DISCHARGE:  LH                                HISTORY & PHYSICAL   The patient is a 25 year old gravida 4, para 3, with an EDC of Nov 21, 2004,  placing her at 39+ weeks gestation.  The patient presents in early stages of  labor with initial examination by the nursing staff noted to be 3 cm dilated  with very irregular uterine contractions.  The patient's prenatal course was  complicated by previous pregnancy care in [place].  The patient was a  late transfer to our office and actually came to our office as a result of  an initial visit to labor and delivery on July 23, 2004, at which time  she was 22-5/[redacted] weeks gestation, and I was contacted as the OB unassigned  care Nethan Caudillo on that date of service.  Normal anatomic survey was performed  per [facility] at that time.  Female fetus was identified.  The  patient's prenatal course is complicated by extensive history documented  during that hospitalization of having a positive RPR titer with unknown  duration but no signs or symptoms of neurosyphilis.  The patient was noted  to have an RPR titer of 1 to 128.  She appropriately then received an  injection of Bicillin during that hospitalization, and 7 days later received  a repeat injection at the health department, planned on giving her a total  of series of three, but she was notified per [facility]  Department  that she did not need a series of 3.  She has had serial RPR titers done  since that point in time.  As expected, the RPR titer remains positive, but  the titer is decreasing.  The last available titer has been performed  sometime within the last month, the results of which are  not here on the  prenatal record.  The patient will have RPR titer also repeated at time of  admission.  The patient noted prenatal labs to have normal glucose tolerance  test with the previous pregnancy.  She is negative Sickledex by her history,  rubella immune.  As stated previously, positive RPR titer July 12, 2004,  of 1 to 128.  Hemoglobin 10.7, hematocrit 31.4.  HIV is negative.  Hepatitis  B surface antigen is negative.  The patient is noted to have negative  gonorrhea and chlamydia cultures performed September 22, 2004.   Notably during patient's initial hospitalization July 12, 2004, she did  have some genital lesions which were consistent with herpes simplex virus.  She did have antibody titers performed dated July 22, 2004.  The combined  titer technique was performed which does not delineate type 1 versus type 2.  The patient was noted to have a very high IgG antibody at that  time  indicative of previous infection.  The herpes simplex type 1 and type 2 IgM  was noted to be equivocal at 0.97.  The patient otherwise has had serial  ultrasounds which have always documented normal anatomic survey and good  fetal growth.  This case has been discussed briefly with dr. Vivia Ewing,  who will be providing care for the patient.  Due to the history of probable  genital herpes, the patient has been on suppressive therapy, taking Valtrex  500 mg p.o. daily. She has also been treated previously with Flagyl 500 mg  p.o. b.i.d. x 7 days for trichomonas noted on her Pap smear.   PAST MEDICAL HISTORY:   ALLERGIES:  No known drug allergies.   CURRENT MEDICATIONS:  Prenatal vitamins and Valtrex.   OBSTETRICAL HISTORY:  July 2000, vaginal delivery in [place], 7 pounds 12  ounce.  January 2003, vaginal delivery in [place], 7 pounds 11 ounces.  August 13, 2003, vaginal delivery in [place], 7 pounds 14 ounces.  She  denies any significant problems with those labors or deliveries.   She denies  any alcohol or drug use.   SOCIAL HISTORY:  Present in the room with her, I believe, are her father and  her step mother.   PHYSICAL EXAMINATION:  GENERAL:  She is noted to be a black female.  VITAL SIGNS:  Blood pressure 132/65, pulse 91 to 100, respiratory rate 20.  HEENT:  Negative.  No adenopathy.  NECK:  Supple.  Thyroid is not palpable.  LUNGS:  Clear.  CARDIOVASCULAR:  Regular rate and rhythm.  ABDOMEN:  Soft and nontender.  No surgical scars are identified.  She is  vertex presentation by Leopold's maneuver.  Fundal height is 36 cm.  EXTREMITIES:  Reveal only trace pretibial edema.  PELVIC:  Initial examination by nursing staff was 3 cm.  On my exam, she is  noted to be 5 cm dilated, -1 station, 60% effaced, with vertex posterior but  well to the cervix.  Pelvis is noted to be clinically adequate.  There are  no external genital lesions identified.  External fetal monitor reveals  uterine contractions every 5 to 7 minutes of palpably mild intensity.  Fetal  heart rate, though, is reassuring with accelerations as noted.   ADDITIONAL LABORATORY STUDIES:  The patient's GBS carrier status is unknown.  It is unclear if she has not had that done in the office due to missed  visits, though she does state she has not been seen for greater than two  weeks duration.   IMPRESSION AND PLAN:  Thus, amniotomy is performed.  The patient is to be  managed expectantly.  Subsequent Pitocin augmentation or induction can be  initiated.  Fetal heart rate is reassuring at this time.  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      DC/MEDQ  D:  11/14/2004  T:  11/14/2004  Job:  [id number]

## 2010-11-26 NOTE — Discharge Summary (Signed)
[name], [name]               ACCOUNT NO.:  [id number]   MEDICAL RECORD NO.:  [id number]          PATIENT TYPE:  OIB   LOCATION:  LDR3                          FACILITY:  [facility]   PHYSICIAN:  Langley Gauss, MD     DATE OF BIRTH:  1985-11-15   DATE OF ADMISSION:  10/31/2004  DATE OF DISCHARGE:  04/23/2006LH                                 DISCHARGE SUMMARY   OB OBSERVATION NOTE:  A 25 year old gravida 4, para 3, [redacted] weeks gestation,  complains of steady pelvic pressure.  Denies any significant uterine  contractions.   Prenatal course complicated by OB unassigned initiation of care.  Initial  ultrasound at [redacted] weeks gestation gave an [facility] of Nov 21, 2004, giving her  estimated gestational age of [redacted] weeks now.  The patient is hospitalized with  secondary syphilis, was treated appropriately.  Serial titers of RPR have  been had the highest titer of 1 to 128.  Liver function tests and  coagulation studies within normal limits.  B positive blood type.  GC and  chlamydia cultures negative.  She did test positive for herpes simplex IgG.  Thus she has been placed on Valtrex suppressive therapy 500 mg p.o. daily.  Hepatitis B is negative.  HIV is negative.  Repeat RPR titer is not located  in the prenatals on labor and delivery.   OB HISTORY:  Pertinent for three prior vaginal deliveries.   PHYSICAL EXAMINATION:  GENERAL:  No acute distress.  Nonstress test is  requested due to maternal history of syphilis.  VITAL SIGNS:  Fetal heart rate baseline 150, accelerations noted at greater  than 15 beats per minute x greater than 15 seconds duration.  No fetal heart  rate decelerations are noted.  Assessment of the reactive nonstress test:  External toco reveals uterine contractions occurring very irregularly,  every 10 to 20 minutes.  These are not perceived by the patient.  ABDOMEN:  Obese.  Vertex presentation by Leopold's maneuvers.  Uterine tone  is normal.  PELVIC:  3+ cm dilated, 60%  effaced, vertex in -1 station.   ASSESSMENT AND PLAN:  1.  Maternal history of syphilis adequately treated during this pregnancy.  2.  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   Signs and symptoms of labor as well as spontaneous rupture of membranes  reviewed with the patient, and she is discharged to home.  Given 10 mg p.o.  Ambien and will be seen in the office this week.  Discussed with the patient  with multiparous favorable cervix, could proceed with amniotomy and  induction of labor post [redacted] weeks gestation.    DC/MEDQ  D:  10/31/2004  T:  10/31/2004  Job:  [id number]

## 2012-09-20 ENCOUNTER — Encounter (HOSPITAL_COMMUNITY): Payer: Self-pay

## 2012-09-20 ENCOUNTER — Emergency Department (HOSPITAL_COMMUNITY)
Admission: EM | Admit: 2012-09-20 | Discharge: 2012-09-20 | Disposition: A | Discharge status: Home / Self Care | Payer: 59 | Attending: Emergency Medicine | Admitting: Emergency Medicine

## 2012-09-20 DIAGNOSIS — R300 Dysuria: Secondary | ICD-10-CM | POA: Insufficient documentation

## 2012-09-20 DIAGNOSIS — I10 Essential (primary) hypertension: Secondary | ICD-10-CM | POA: Insufficient documentation

## 2012-09-20 DIAGNOSIS — R350 Frequency of micturition: Secondary | ICD-10-CM | POA: Insufficient documentation

## 2012-09-20 DIAGNOSIS — N390 Urinary tract infection, site not specified: Secondary | ICD-10-CM | POA: Insufficient documentation

## 2012-09-20 DIAGNOSIS — F17200 Nicotine dependence, unspecified, uncomplicated: Secondary | ICD-10-CM | POA: Insufficient documentation

## 2012-09-20 DIAGNOSIS — J45909 Unspecified asthma, uncomplicated: Secondary | ICD-10-CM | POA: Insufficient documentation

## 2012-09-20 DIAGNOSIS — Z3202 Encounter for pregnancy test, result negative: Secondary | ICD-10-CM | POA: Insufficient documentation

## 2012-09-20 HISTORY — DX: Unspecified asthma, uncomplicated: J45.909

## 2012-09-20 HISTORY — DX: Essential (primary) hypertension: I10

## 2012-09-20 HISTORY — DX: Urinary tract infection, site not specified: N39.0

## 2012-09-20 HISTORY — DX: Other seasonal allergic rhinitis: J30.2

## 2012-09-20 LAB — URINALYSIS, ROUTINE W REFLEX MICROSCOPIC
Nitrite: NEGATIVE
Protein, ur: 30 mg/dL — AB
Urobilinogen, UA: 0.2 mg/dL (ref 0.0–1.0)

## 2012-09-20 LAB — URINE MICROSCOPIC-ADD ON

## 2012-09-20 MED ORDER — PHENAZOPYRIDINE HCL 100 MG PO TABS: 95.0000 mg | ORAL_TABLET | Freq: Once | ORAL | Status: DC

## 2012-09-20 MED ORDER — HYDROCODONE-ACETAMINOPHEN 5-325 MG PO TABS
1.0000 | ORAL_TABLET | Freq: Once | ORAL | Status: AC
  Administered 2012-09-20: 1 via ORAL
  Filled 2012-09-20: qty 1

## 2012-09-20 MED ORDER — NITROFURANTOIN MONOHYD MACRO 100 MG PO CAPS
100.0000 mg | ORAL_CAPSULE | Freq: Once | ORAL | Status: AC
  Administered 2012-09-20: 100 mg via ORAL
  Filled 2012-09-20: qty 1

## 2012-09-20 MED ORDER — PHENAZOPYRIDINE HCL 100 MG PO TABS
200.0000 mg | ORAL_TABLET | Freq: Once | ORAL | Status: AC
  Administered 2012-09-20: 200 mg via ORAL
  Filled 2012-09-20: qty 2

## 2012-09-20 MED ORDER — PHENAZOPYRIDINE HCL 200 MG PO TABS: 200.0000 mg | ORAL_TABLET | Freq: Three times a day (TID) | ORAL | Status: DC

## 2012-09-20 MED ORDER — NITROFURANTOIN MONOHYD MACRO 100 MG PO CAPS: 100.0000 mg | ORAL_CAPSULE | Freq: Two times a day (BID) | ORAL | Status: DC

## 2012-09-20 NOTE — ED Provider Notes (Signed)
Medical screening examination/treatment/procedure(s) were performed by non-physician practitioner and as supervising physician I was immediately available for consultation/collaboration.  Tobin Chad, MD 09/20/12 1328

## 2012-09-20 NOTE — ED Provider Notes (Signed)
History     CSN: [id number]  Arrival date & time 09/20/12  1022   First MD Initiated Contact with Patient 09/20/12 1255      Chief Complaint  Patient presents with  . Hematuria    (Consider location/radiation/quality/duration/timing/severity/associated sxs/prior treatment) HPI Comments: Patient presents with history of dysuria and hematuria for the past 2 weeks worsening this morning. She has had urinary symptoms in the past however never this severe. She denies abdominal pain, fever, back pain. She has not had vomiting. No treatments prior to arrival. Onset of symptoms gradual. Course is gradually worsening. Nothing makes symptoms better or worse.  Patient is a 27 y.o. female presenting with hematuria. The history is provided by the patient.  Hematuria Pertinent negatives include no abdominal pain, chest pain, coughing, fever, headaches, myalgias, nausea, rash, sore throat or vomiting.    Past Medical History  Diagnosis Date  . UTI (lower urinary tract infection)   . Hypertension   . Asthma   . Seasonal allergies     History reviewed. No pertinent past surgical history.  No family history on file.  History  Substance Use Topics  . Smoking status: Current Every Day Smoker  . Smokeless tobacco: Never Used  . Alcohol Use: No    OB History   Grav Para Term Preterm Abortions TAB SAB Ect Mult Living                  Review of Systems  Constitutional: Negative for fever.  HENT: Negative for sore throat and rhinorrhea.   Eyes: Negative for redness.  Respiratory: Negative for cough.   Cardiovascular: Negative for chest pain.  Gastrointestinal: Negative for nausea, vomiting, abdominal pain and diarrhea.  Genitourinary: Positive for dysuria, frequency and hematuria. Negative for flank pain, vaginal bleeding and vaginal discharge.  Musculoskeletal: Negative for myalgias and back pain.  Skin: Negative for rash.  Neurological: Negative for headaches.    Allergies   Review of patient's allergies indicates no known allergies.  Home Medications   Current Outpatient Rx  Name  Route  Sig  Dispense  Refill  . nitrofurantoin, macrocrystal-monohydrate, (MACROBID) 100 MG capsule   Oral   Take 1 capsule (100 mg total) by mouth 2 (two) times daily.   9 capsule   0   . phenazopyridine (PYRIDIUM) 200 MG tablet   Oral   Take 1 tablet (200 mg total) by mouth 3 (three) times daily.   6 tablet   0     BP 134/84  Pulse 71  Temp(Src) 98.2 F (36.8 C) (Oral)  Resp 20  SpO2 100%  LMP 09/13/2012  Physical Exam  Nursing note and vitals reviewed. Constitutional: She appears well-developed and well-nourished.  HENT:  Head: Normocephalic and atraumatic.  Eyes: Conjunctivae are normal. Right eye exhibits no discharge. Left eye exhibits no discharge.  Neck: Normal range of motion. Neck supple.  Cardiovascular: Normal rate, regular rhythm and normal heart sounds.   Pulmonary/Chest: Effort normal and breath sounds normal.  Abdominal: Soft. There is tenderness (mild) in the suprapubic area. There is no rebound, no guarding and no CVA tenderness.  Neurological: She is alert.  Skin: Skin is warm and dry.  Psychiatric: She has a normal mood and affect.    ED Course  Procedures (including critical care time)  Labs Reviewed  URINALYSIS, ROUTINE W REFLEX MICROSCOPIC - Abnormal; Notable for the following:    APPearance CLOUDY (*)    Hgb urine dipstick TRACE (*)    Protein, ur  30 (*)    Leukocytes, UA MODERATE (*)    All other components within normal limits  URINE MICROSCOPIC-ADD ON - Abnormal; Notable for the following:    Bacteria, UA FEW (*)    All other components within normal limits  URINE CULTURE  POCT PREGNANCY, URINE   No results found.   1. Urinary tract infection     1:04 PM Patient seen and examined. Work-up initiated. Medications ordered.   Vital signs reviewed and are as follows: Filed Vitals:   09/20/12 1059  BP: 134/84  Pulse:  71  Temp: 98.2 F (36.8 C)  Resp: 20   Patient urged to return with worsening symptoms or other concerns. Patient verbalized understanding and agrees with plan.      MDM  Patient clinically with cystitis confirmed on UA. No vaginal discharge or bleeding. No symptoms of pyelonephritis. Patient appears well, nontoxic. First dose of medications given in emergency department.       Renne Crigler, PA-C 09/20/12 1306

## 2012-09-20 NOTE — ED Notes (Signed)
Pt. Reports having hematuria for 2 weeks,  Having severe pain when she voids, frequency.   Pt.has a hx of UTI.  Denies any vaginal discharge, bleeding or pain.. Is having lt. Flank pain

## 2012-09-22 LAB — URINE CULTURE

## 2012-09-23 ENCOUNTER — Telehealth (HOSPITAL_COMMUNITY): Payer: Self-pay | Admitting: Emergency Medicine

## 2012-09-23 NOTE — ED Notes (Signed)
+  Urine. Patient treated with Macrobid. Sensitive to same. Per protocol MD. °

## 2012-09-23 NOTE — ED Notes (Signed)
Patient has +Urine culture. Checking to see if treated appropriately. °

## 2020-10-07 ENCOUNTER — Telehealth: Payer: Self-pay

## 2020-10-07 NOTE — Telephone Encounter (Addendum)
Pt contacted clinic by phone to inquire about process of enrolling into the [facility] Connect program.   States she was referred by [facility] [place].  Pt was interviewed regarding program requirements.  Pt was found to complete requirements successfully  Pt received a medical history and current medical needs interview to assess her need for a primary care provider.  States her chief medical complaints are: Pain in tooth with recent abscess that burst on own, in addition to currently having a broken tooth.  States she is also in need  of disease management of her asthma condition  All required documents on checklist were reviewed to have available during enrollment  appointment.    Appt was scheduled with the Care Connect Program for uninsured as an  office visit on October 09, 2020 at 1:30 pm with Abran Duke, Program Manager of Care connect  A reminder text message was sent with appointment information and required documents to bring to appointment   Pt stated understanding and call ended.

## 2020-10-12 ENCOUNTER — Telehealth: Payer: Self-pay

## 2020-10-12 NOTE — Telephone Encounter (Signed)
Called client to follow up as she was enrolled in [facility] Connect on 10/09/20. Client wishes [facility] with [facility].  Client presented with stuffy, runny nose and itchy scratchy throat and cough , she is vaccinated x 2 with Moderna. Recommended on 10/09/20 to get a covid test prior to [facility] visit.  Assisted client to schedule a covid test at [facility] in [place] for 10/10/20.   Called today and client reports she went to [facility] on 10/10/20 and they told her they could not do her test and rescheduled her for next week. Discussed with [facility] staff and provider. Will reschedule client for 10/15/20 at 0900. Will also recommend client go to [facility] drive thru testing today which is open until 6PM to have test results back before 10/15/20. Client called and given instructions and changed appointment for [facility] for 10/15/20 at 0900. She is agreeable. Will plan to call tomorrow to determine if client was able to receive a Covid test. Client reports her father can drive her today.    Francee Nodal RN [employer]

## 2020-10-13 ENCOUNTER — Telehealth: Payer: Self-pay

## 2020-10-13 NOTE — Telephone Encounter (Signed)
Called to follow up if client had received her covid test 10/12/20 at [facility] prior to her [facility] new patient appointment 10/15/20.  No answer and no voicemail set up , unable to leave message.  Will attempt later today.   Francee Nodal RN [employer]

## 2020-10-14 ENCOUNTER — Ambulatory Visit: Payer: Self-pay | Admitting: Physician Assistant

## 2020-10-15 ENCOUNTER — Telehealth: Payer: Self-pay

## 2020-10-15 ENCOUNTER — Ambulatory Visit: Payer: Self-pay | Admitting: Physician Assistant

## 2020-10-15 NOTE — Telephone Encounter (Signed)
Called client to follow up if she had Covid tested at [facility] this week and if she had received her results prior to her [facility] appointment today.  Client reports she was tested, but as of last night had not received her results. Asked client to recheck to see if she had them come in overnight or this morning. She reports she will check and call back.  Will await return call. Again discussed that due to her symptoms that [facility] requested she have her tests and results prior to being seen in office for a new patient appointment. Client reports understanding and is still complaining of cough, itchy throat, nasal stuffiness and runny nose, denies fever.  Will await her call to update [facility] staff.  Francee Nodal RN [employer]

## 2020-10-15 NOTE — Telephone Encounter (Signed)
[id number] and have not heard back from [facility] client regarding covid test results. Called both numbers listed. Mobile number phone off and no voicemail set up. Alternate home number again, that I previously talked with client this am on and it rings and rings no answer. Client's appointment with [facility] at 0900 today. Notified [facility].  Will attempt to call again today and determine if client has her results and determine rescheduling of [facility] appointment.   Francee Nodal RN [employer]

## 2020-10-21 ENCOUNTER — Telehealth: Payer: Self-pay

## 2020-10-21 NOTE — Telephone Encounter (Signed)
Called to follow up with [facility] client. She was to get covid tested prior to her first [facility] appointment.  Client reports that she was now tested on 10/19/20 and she will call back to let me know her results so we can schedule her appointment with [facility].  Will continue to follow as needed.   Francee Nodal RN  [employer]

## 2020-10-21 NOTE — Telephone Encounter (Signed)
Client called back, she was covid tested at [facility] on [address] [place] on 10/19/20 she received her results that are NEGATIVE. She is sending me confirmation and that document will be scanned into [facility] for [facility].  Reschedule her to establish care with [facility] on 10/28/20 at 10:30am. Client texted date and time of appointment as well as contact information.  Will continue to follow client after first visit.   Francee Nodal RN [employer]

## 2020-10-21 NOTE — Congregational Nurse Program (Signed)
Care Coordination note updated to reflect Care Connect enrollment as well as [place] [facility] approved from 10/09/20-10/09/21.   Francee Nodal RN [employer]

## 2020-10-28 ENCOUNTER — Telehealth: Payer: Self-pay

## 2020-10-28 ENCOUNTER — Ambulatory Visit: Payer: Self-pay | Admitting: Physician Assistant

## 2020-10-28 NOTE — Telephone Encounter (Signed)
Called to follow up regarding [facility] appointment today that client missed. No answer and unable to leave a voicemail.  Client previously reported on 10/21/20 that she received a negative to her COVID test from [facility] performed on 10/19/20. I had at that time instructed client to save that email or text to show at her appointment today and a copy client texted me was placed into [facility] EMR of Care Connect under documents.  Will attempt to call again tomorrow.   Francee Nodal RN [employer]

## 2020-10-29 ENCOUNTER — Encounter: Payer: Self-pay | Admitting: Physician Assistant

## 2020-11-02 ENCOUNTER — Telehealth: Payer: Self-pay

## 2020-11-02 NOTE — Telephone Encounter (Signed)
Attempted to call client. No answer and unable to leave voicemail as none is setup.   Francee Nodal RN [employer]

## 2020-11-18 ENCOUNTER — Other Ambulatory Visit: Payer: Self-pay

## 2020-11-18 ENCOUNTER — Encounter: Payer: Self-pay | Admitting: Physician Assistant

## 2020-11-18 ENCOUNTER — Ambulatory Visit: Payer: Self-pay | Admitting: Physician Assistant

## 2020-11-18 VITALS — BP 157/102 | HR 76 | Temp 97.8°F | Ht 68.75 in | Wt 292.0 lb

## 2020-11-18 DIAGNOSIS — I10 Essential (primary) hypertension: Secondary | ICD-10-CM

## 2020-11-18 DIAGNOSIS — Z13220 Encounter for screening for lipoid disorders: Secondary | ICD-10-CM

## 2020-11-18 DIAGNOSIS — Z7689 Persons encountering health services in other specified circumstances: Secondary | ICD-10-CM

## 2020-11-18 DIAGNOSIS — F419 Anxiety disorder, unspecified: Secondary | ICD-10-CM | POA: Insufficient documentation

## 2020-11-18 DIAGNOSIS — Z131 Encounter for screening for diabetes mellitus: Secondary | ICD-10-CM

## 2020-11-18 DIAGNOSIS — Z8632 Personal history of gestational diabetes: Secondary | ICD-10-CM

## 2020-11-18 DIAGNOSIS — K0889 Other specified disorders of teeth and supporting structures: Secondary | ICD-10-CM

## 2020-11-18 DIAGNOSIS — F1290 Cannabis use, unspecified, uncomplicated: Secondary | ICD-10-CM

## 2020-11-18 DIAGNOSIS — F17200 Nicotine dependence, unspecified, uncomplicated: Secondary | ICD-10-CM

## 2020-11-18 MED ORDER — AMOXICILLIN 500 MG PO CAPS: 500.0000 mg | ORAL_CAPSULE | Freq: Three times a day (TID) | ORAL | 0 refills | Status: AC

## 2020-11-18 MED ORDER — LOSARTAN POTASSIUM 50 MG PO TABS: 50.0000 mg | ORAL_TABLET | Freq: Every day | ORAL | 0 refills | Status: DC

## 2020-11-18 NOTE — Progress Notes (Signed)
BP (!) 157/102   Pulse 76   Temp 97.8 F (36.6 C)   Ht 5' 8.75" (1.746 m)   Wt 292 lb (132.5 kg)   SpO2 98%   BMI 43.44 kg/m    Subjective:    Patient ID: [name], female    DOB: February 08, 1986, 34 y.o.   MRN: [id number]  HPI: [name] is a 35 y.o. female presenting on 11/18/2020 for No chief complaint on file.   HPI    Pt had a negative covid 19 screening questionaire.    Pt is 34yoF who presents to establish care.  She says she has not been going anywhere for primary care or MH care recently.    She Was on losartan for htn in the past, about 2 yr ago in [place].  Pt moved to be closer to her adult children.  She does not live with her children.  Pt C/o  Tooth pain.  She says her wisdom tooth broke off in the back.  She got covid vaccination but doesn't have her card with her.  She has been having anxiety for about 3 months.   She says she never had that in the past.  She has trouble sleeping also.   She does not have any depression, just anxiety.  [facility] but says she needs to scheudle follow up      Relevant past medical, surgical, family and social history reviewed and updated as indicated. Interim medical history since our last visit reviewed. Allergies and medications reviewed and updated.   No current outpatient medications on file.   Review of Systems  Per HPI unless specifically indicated above     Objective:    BP (!) 157/102   Pulse 76   Temp 97.8 F (36.6 C)   Ht 5' 8.75" (1.746 m)   Wt 292 lb (132.5 kg)   SpO2 98%   BMI 43.44 kg/m   Wt Readings from Last 3 Encounters:  11/18/20 292 lb (132.5 kg)    Physical Exam Vitals reviewed.  Constitutional:      General: She is not in acute distress.    Appearance: She is well-developed. She is obese. She is not toxic-appearing.  HENT:     Head: Normocephalic and atraumatic.     Right Ear: Tympanic membrane and ear canal normal.     Left Ear: Tympanic membrane and ear  canal normal.  Eyes:     Conjunctiva/sclera: Conjunctivae normal.     Pupils: Pupils are equal, round, and reactive to light.  Neck:     Thyroid: No thyromegaly.  Cardiovascular:     Rate and Rhythm: Normal rate and regular rhythm.  Pulmonary:     Effort: Pulmonary effort is normal.     Breath sounds: Normal breath sounds.  Abdominal:     General: Bowel sounds are normal.     Palpations: Abdomen is soft. There is no mass.     Tenderness: There is no abdominal tenderness.  Musculoskeletal:     Cervical back: Neck supple.     Right lower leg: No edema.     Left lower leg: No edema.  Lymphadenopathy:     Cervical: No cervical adenopathy.  Skin:    General: Skin is warm and dry.  Neurological:     Mental Status: She is alert and oriented to person, place, and time.     Motor: No weakness or tremor.     Gait: Gait normal.  Deep Tendon Reflexes:     Reflex Scores:      Patellar reflexes are 2+ on the right side and 2+ on the left side. Psychiatric:        Attention and Perception: Attention normal.        Mood and Affect: Mood is anxious.        Speech: Speech normal.        Behavior: Behavior normal. Behavior is cooperative.     Comments: Pt bounces through most of her appointment exhibiting anxiety.  She engages normally in conversation and responds to questions appropriately.               Assessment & Plan:    Encounter Diagnoses  Name Primary?  . Encounter to establish care Yes  . Primary hypertension   . Anxiety   . Morbid obesity (HCC)   . Tobacco use disorder   . Marijuana use   . Dentalgia   . Screening for diabetes mellitus   . History of gestational diabetes   . Screening cholesterol level       -will Restart losartan for htn -rx amoxil for her tooth and she is put on dental list -pt encouraged to Return to [facility] as soon as possible for assistance with her anxiety -will get Baseline labs -pt to follow up 1 month.  She is to contact  office sooner prn

## 2020-11-19 ENCOUNTER — Telehealth: Payer: Self-pay

## 2020-11-19 NOTE — Telephone Encounter (Signed)
Attempted to call client for follow up after her [facility] appointment to establish care. No answer and there is no voicemail set up.    Francee Nodal RN [employer]/[facility]

## 2020-12-22 ENCOUNTER — Ambulatory Visit: Payer: Self-pay | Admitting: Physician Assistant

## 2020-12-30 ENCOUNTER — Ambulatory Visit: Payer: Self-pay | Admitting: Physician Assistant

## 2021-01-05 ENCOUNTER — Encounter: Payer: Self-pay | Admitting: Physician Assistant

## 2021-03-10 ENCOUNTER — Encounter (HOSPITAL_COMMUNITY): Payer: Self-pay | Admitting: Emergency Medicine

## 2021-03-10 ENCOUNTER — Other Ambulatory Visit: Payer: Self-pay

## 2021-03-10 ENCOUNTER — Emergency Department (HOSPITAL_COMMUNITY)
Admission: EM | Admit: 2021-03-10 | Discharge: 2021-03-10 | Disposition: A | Discharge status: Home / Self Care | Payer: 59 | Attending: Emergency Medicine | Admitting: Emergency Medicine

## 2021-03-10 ENCOUNTER — Encounter (HOSPITAL_COMMUNITY): Payer: Self-pay | Admitting: *Deleted

## 2021-03-10 ENCOUNTER — Emergency Department (HOSPITAL_COMMUNITY)
Admission: EM | Admit: 2021-03-10 | Discharge: 2021-03-10 | Disposition: A | Discharge status: Home / Self Care | Payer: 59 | Source: Home / Self Care | Attending: Emergency Medicine | Admitting: Emergency Medicine

## 2021-03-10 DIAGNOSIS — J45909 Unspecified asthma, uncomplicated: Secondary | ICD-10-CM | POA: Insufficient documentation

## 2021-03-10 DIAGNOSIS — I10 Essential (primary) hypertension: Secondary | ICD-10-CM | POA: Insufficient documentation

## 2021-03-10 DIAGNOSIS — K0889 Other specified disorders of teeth and supporting structures: Secondary | ICD-10-CM | POA: Insufficient documentation

## 2021-03-10 DIAGNOSIS — F17210 Nicotine dependence, cigarettes, uncomplicated: Secondary | ICD-10-CM | POA: Insufficient documentation

## 2021-03-10 DIAGNOSIS — K029 Dental caries, unspecified: Secondary | ICD-10-CM | POA: Insufficient documentation

## 2021-03-10 DIAGNOSIS — Z79899 Other long term (current) drug therapy: Secondary | ICD-10-CM | POA: Insufficient documentation

## 2021-03-10 MED ORDER — BUPIVACAINE-EPINEPHRINE (PF) 0.5% -1:200000 IJ SOLN
1.8000 mL | Freq: Once | INTRAMUSCULAR | Status: AC
  Administered 2021-03-10: 1.8 mL
  Filled 2021-03-10: qty 1.8

## 2021-03-10 MED ORDER — BUPIVACAINE HCL (PF) 0.5 % IJ SOLN
1.0000 mL | Freq: Once | INTRAMUSCULAR | Status: DC
Start: 2021-03-10 — End: 2021-03-10
  Filled 2021-03-10: qty 30

## 2021-03-10 MED ORDER — OXYCODONE-ACETAMINOPHEN 5-325 MG PO TABS: 1.0000 | ORAL_TABLET | Freq: Four times a day (QID) | ORAL | 0 refills | Status: AC | PRN

## 2021-03-10 MED ORDER — OXYCODONE-ACETAMINOPHEN 5-325 MG PO TABS
2.0000 | ORAL_TABLET | Freq: Once | ORAL | Status: AC
  Administered 2021-03-10: 2 via ORAL
  Filled 2021-03-10: qty 2

## 2021-03-10 MED ORDER — BUPIVACAINE IN DEXTROSE 0.75-8.25 % IT SOLN
2.0000 mL | Freq: Once | INTRATHECAL | Status: DC
  Filled 2021-03-10: qty 2

## 2021-03-10 MED ORDER — KETOROLAC TROMETHAMINE 60 MG/2ML IM SOLN
60.0000 mg | Freq: Once | INTRAMUSCULAR | Status: AC
  Administered 2021-03-10: 60 mg via INTRAMUSCULAR
  Filled 2021-03-10: qty 2

## 2021-03-10 MED ORDER — AMOXICILLIN-POT CLAVULANATE 875-125 MG PO TABS: 1.0000 | ORAL_TABLET | Freq: Two times a day (BID) | ORAL | 0 refills | Status: AC

## 2021-03-10 NOTE — ED Provider Notes (Signed)
[facility] EMERGENCY DEPARTMENT Provider Note   CSN: [id number] Arrival date & time: 03/10/21  0131     History Chief Complaint  Patient presents with   Dental Pain    [name] is a 35 y.o. female.   Dental Pain Location:  Upper Upper teeth location:  13/LU 2nd bicuspid Quality:  Sharp, aching and localized Severity:  Severe Onset quality:  Gradual Duration:  2 days Timing:  Constant Progression:  Worsening Chronicity:  New Relieved by:  Nothing Worsened by:  Nothing Associated symptoms: gum swelling   Associated symptoms: no congestion, no difficulty swallowing, no fever, no neck swelling, no oral bleeding and no trismus   Risk factors: no alcohol problem       Past Medical History:  Diagnosis Date   Asthma    Hypertension    Seasonal allergies    UTI (lower urinary tract infection)     Patient Active Problem List   Diagnosis Date Noted   Primary hypertension 11/18/2020   Anxiety 11/18/2020   Morbid obesity (HCC) 11/18/2020   Tobacco use disorder 11/18/2020    Past Surgical History:  Procedure Laterality Date   TUBAL LIGATION       OB History   No obstetric history on file.     Family History  Problem Relation Age of Onset   Cancer Father     Social History   Tobacco Use   Smoking status: Every Day    Packs/day: 0.50    Types: Cigarettes   Smokeless tobacco: Never  Vaping Use   Vaping Use: Every day  Substance Use Topics   Alcohol use: No   Drug use: Not Currently    Types: Marijuana    Comment: twice/week    Home Medications Prior to Admission medications   Medication Sig Start Date End Date Taking? Authorizing Provider  losartan (COZAAR) 50 MG tablet Take 1 tablet (50 mg total) by mouth daily. 11/18/20   Jacquelin Hawking, PA-C    Allergies    Patient has no known allergies.  Review of Systems   Review of Systems  Constitutional:  Negative for fever.  HENT:  Negative for congestion.   All other systems reviewed  and are negative.  Physical Exam Updated Vital Signs BP 132/87 (BP Location: Right Arm)   Pulse 85   Temp 98.7 F (37.1 C)   Resp 18   Ht 5\' 8"  (1.727 m)   Wt 131.5 kg   LMP 02/12/2021   SpO2 98%   BMI 44.09 kg/m   Physical Exam Vitals and nursing note reviewed.  HENT:     Mouth/Throat:     Mouth: Mucous membranes are moist.     Pharynx: Oropharynx is clear.      Comments: Broken tooth Eyes:     Pupils: Pupils are equal, round, and reactive to light.  Cardiovascular:     Rate and Rhythm: Normal rate.  Pulmonary:     Effort: Pulmonary effort is normal.  Abdominal:     General: Abdomen is flat.  Skin:    General: Skin is warm and dry.  Neurological:     General: No focal deficit present.   ED Results / Procedures / Treatments   Labs (all labs ordered are listed, but only abnormal results are displayed) Labs Reviewed - No data to display  EKG None  Radiology No results found.  Procedures Dental Block  Date/Time: 03/10/2021 5:11 AM Performed by: 03/12/2021, MD Authorized by: Marily Memos, MD  Consent:    Consent obtained:  Verbal   Consent given by:  Patient   Risks, benefits, and alternatives were discussed: yes     Risks discussed:  Allergic reaction, hematoma, intravascular injection, infection, pain and nerve damage Universal protocol:    Procedure explained and questions answered to patient or proxy's satisfaction: yes     Relevant documents present and verified: yes     Test results available: yes     Patient identity confirmed:  Arm band Indications:    Indications: dental pain   Procedure details:    Needle gauge:  24 G   Anesthetic injected:  Bupivacaine 0.5% WITH epi   Injection procedure:  Anatomic landmarks identified, introduced needle, negative aspiration for blood and incremental injection Post-procedure details:    Outcome:  Pain relieved   Procedure completion:  Tolerated   Medications Ordered in ED Medications  ketorolac  (TORADOL) injection 60 mg (60 mg Intramuscular Given 03/10/21 0340)  oxyCODONE-acetaminophen (PERCOCET/ROXICET) 5-325 MG per tablet 2 tablet (2 tablets Oral Given 03/10/21 0340)  bupivacaine-epinephrine (MARCAINE W/ EPI) 0.5% -1:200000 injection 1.8 mL (1.8 mLs Infiltration Given 03/10/21 0341)    ED Course  I have reviewed the triage vital signs and the nursing notes.  Pertinent labs & imaging results that were available during my care of the patient were reviewed by me and considered in my medical decision making (see chart for details).    MDM Rules/Calculators/A&P                          Broken tooth pain. Improved considerably with block.  Final Clinical Impression(s) / ED Diagnoses Final diagnoses:  Pain, dental    Rx / DC Orders ED Discharge Orders     None        Yoshi Mancillas, Barbara Cower, MD 03/10/21 [phone]

## 2021-03-10 NOTE — ED Provider Notes (Signed)
[facility] EMERGENCY DEPARTMENT Provider Note   CSN: [id number] Arrival date & time: 03/10/21  1210     History Chief Complaint  Patient presents with   Dental Pain    [name] is a 35 y.o. female with a history of asthma, hypertension.  Presents to emergency department with a chief complaint of dental pain and facial swelling.  Patient reports that she injured her tooth 2 to 3 weeks prior.  Patient has had constant dental pain since then.  Pain has gotten progressively worse over time.  Patient rates pain 10/10 on the pain scale.  Patient reports minimal relief with ibuprofen.  Patient denies any aggravating symptoms.  Patient reports that facial swelling started this morning.  Patient swelling has remained constant since then.  Patient denies any fevers, chills, neck pain, neck stiffness, drooling, high potato voice, trouble swallowing, sore throat, purulent discharge, visual disturbance, numbness, weakness.  Per chart review patient was seen in this emergency department earlier this morning.  Patient was noted to have left upper broken tooth, no facial swelling noted.  Patient had dental block with improvement in pain.  Patient has not seen any dentist or oral surgeon for this tooth issue.   Dental Pain Associated symptoms: facial swelling   Associated symptoms: no drooling, no fever and no neck pain       Past Medical History:  Diagnosis Date   Asthma    Hypertension    Seasonal allergies    UTI (lower urinary tract infection)     Patient Active Problem List   Diagnosis Date Noted   Primary hypertension 11/18/2020   Anxiety 11/18/2020   Morbid obesity (HCC) 11/18/2020   Tobacco use disorder 11/18/2020    Past Surgical History:  Procedure Laterality Date   TUBAL LIGATION     TUBAL LIGATION       OB History   No obstetric history on file.     Family History  Problem Relation Age of Onset   Cancer Father     Social History   Tobacco Use    Smoking status: Every Day    Packs/day: 0.50    Types: Cigarettes   Smokeless tobacco: Never  Vaping Use   Vaping Use: Every day  Substance Use Topics   Alcohol use: No   Drug use: Not Currently    Types: Marijuana    Comment: twice/week    Home Medications Prior to Admission medications   Medication Sig Start Date End Date Taking? Authorizing Provider  losartan (COZAAR) 50 MG tablet Take 1 tablet (50 mg total) by mouth daily. 11/18/20   Jacquelin Hawking, PA-C    Allergies    Patient has no known allergies.  Review of Systems   Review of Systems  Constitutional:  Negative for chills and fever.  HENT:  Positive for dental problem and facial swelling. Negative for drooling, sore throat, trouble swallowing and voice change.   Eyes:  Negative for visual disturbance.  Gastrointestinal:  Negative for nausea and vomiting.  Musculoskeletal:  Negative for neck pain and neck stiffness.  Neurological:  Negative for weakness and numbness.   Physical Exam Updated Vital Signs BP (!) 175/111 (BP Location: Right Arm)   Pulse 71   Temp 98.5 F (36.9 C) (Oral)   Resp 18   Ht 5\' 8"  (1.727 m)   Wt 131.5 kg   LMP 02/12/2021   SpO2 100%   BMI 44.09 kg/m   Physical Exam Vitals and nursing note reviewed.  Constitutional:      General: She is not in acute distress.    Appearance: She is not ill-appearing, toxic-appearing or diaphoretic.  HENT:     Head: Normocephalic. No raccoon eyes, Battle's sign, abrasion, contusion, masses, right periorbital erythema, left periorbital erythema or laceration.     Jaw: Trismus, tenderness and swelling present. No pain on movement or malocclusion.     Salivary Glands: Right salivary gland is not diffusely enlarged or tender. Left salivary gland is not diffusely enlarged or tender.     Comments: Swelling overlying left upper jaw.  No erythema or rash noted.    Mouth/Throat:     Mouth: Mucous membranes are dry. No injury, lacerations, oral lesions or  angioedema.     Dentition: Abnormal dentition. Dental tenderness and dental caries present. No dental abscesses.     Tongue: No lesions. Tongue does not deviate from midline.     Palate: No mass and lesions.     Pharynx: Oropharynx is clear. Uvula midline. No pharyngeal swelling, oropharyngeal exudate, posterior oropharyngeal erythema or uvula swelling.     Tonsils: No tonsillar exudate or tonsillar abscesses. 2+ on the right. 2+ on the left.      Comments: Patient has poor dentition with multiple missing teeth and dental caries.  Broken tooth as noted above.  Dental tenderness to broken tooth.  No swelling to submandibular space or palate.  Patient handles oral secretions without difficulty. Eyes:     General: No scleral icterus.       Right eye: No discharge.        Left eye: No discharge.     Extraocular Movements: Extraocular movements intact.     Conjunctiva/sclera: Conjunctivae normal.     Pupils: Pupils are equal, round, and reactive to light.  Cardiovascular:     Rate and Rhythm: Normal rate.  Pulmonary:     Effort: Pulmonary effort is normal.  Musculoskeletal:     Cervical back: Normal range of motion and neck supple. No edema, erythema, signs of trauma, rigidity, torticollis or crepitus. No pain with movement, spinous process tenderness or muscular tenderness. Normal range of motion.  Skin:    General: Skin is warm and dry.  Neurological:     General: No focal deficit present.     Mental Status: She is alert.     GCS: GCS eye subscore is 4. GCS verbal subscore is 5. GCS motor subscore is 6.     Comments: CN II through XII intact  Psychiatric:        Behavior: Behavior is cooperative.    ED Results / Procedures / Treatments   Labs (all labs ordered are listed, but only abnormal results are displayed) Labs Reviewed - No data to display  EKG None  Radiology No results found.  Procedures Procedures   Medications Ordered in ED Medications - No data to display  ED  Course  I have reviewed the triage vital signs and the nursing notes.  Pertinent labs & imaging results that were available during my care of the patient were reviewed by me and considered in my medical decision making (see chart for details).    MDM Rules/Calculators/A&P                           Alert 35 year old female no acute distress, nontoxic-appearing.  Presents to ED with chief complaint of dental pain and facial swelling.  Patient has poor dentition with multiple missing teeth  and dental caries.  Broken tooth as noted above.  Dental tenderness to broken tooth.  No swelling to submandibular space or palate.  Patient handles oral secretions without difficulty.  No signs of angioedema, Ludewig's angina, peritonsillar abscess, dental abscess.  Due to facial swelling concern for dental infection.  We will start patient on Augmentin and give her short course of Percocet.  Patient given resources to follow-up with local dentist and oral surgeon.  Discussed results, findings, treatment and follow up. Patient advised of return precautions. Patient verbalized understanding and agreed with plan.   Final Clinical Impression(s) / ED Diagnoses Final diagnoses:  None    Rx / DC Orders ED Discharge Orders     None        Berneice Heinrich 03/10/21 1455    Gerhard Munch, MD 03/11/21 [phone]

## 2021-03-10 NOTE — ED Triage Notes (Signed)
Pt c/o increased dental pain x 2 days.

## 2021-03-10 NOTE — ED Triage Notes (Signed)
Pt c/o upper left dental pain and swelling to face. Pt reports she was seen here earlier and was given medication for pain, but no antibiotics. Pt concerned she needs antibiotics.

## 2021-03-10 NOTE — Discharge Instructions (Addendum)
You came to the emergency department today to be evaluated for your dental pain and facial swelling.  Due to your facial swelling you were started on the antibiotic Augmentin.  Please take this twice a day for the next 7 days.  Please follow-up with a dental provider as soon as possible to have your tooth reassessed.  You may have diarrhea from the antibiotics.  It is very important that you continue to take the antibiotics even if you get diarrhea unless a medical professional tells you that you may stop taking them.  If you stop too early the bacteria you are being treated for will become stronger and you may need different, more powerful antibiotics that have more side effects and worsening diarrhea.  Please stay well hydrated and consider probiotics as they may decrease the severity of your diarrhea.  Please be aware that if you take any hormonal contraception (birth control pills, nexplanon, the ring, etc) that your birth control will not work while you are taking antibiotics and you need to use back up protection as directed on the birth control medication information insert.    Today you received medications that may make you sleepy or impair your ability to make decisions.  For the next 24 hours please do not drive, operate heavy machinery, care for a small child with out another adult present, or perform any activities that may cause harm to you or someone else if you were to fall asleep or be impaired. You are being prescribed a medication which may make you sleepy. Please follow up of listed precautions for at least 24 hours after taking one dose.   Get help right away if: You are unable to open your mouth. You are having trouble breathing or swallowing. You have a fever. You notice that your face, neck, or jaw is swollen.

## 2021-03-31 ENCOUNTER — Ambulatory Visit: Payer: Self-pay | Admitting: Nurse Practitioner

## 2022-01-31 ENCOUNTER — Emergency Department (HOSPITAL_COMMUNITY): Payer: Self-pay

## 2022-01-31 ENCOUNTER — Emergency Department (HOSPITAL_COMMUNITY)
Admission: EM | Admit: 2022-01-31 | Discharge: 2022-01-31 | Discharge status: Against Medical Advice | Payer: Self-pay | Attending: Emergency Medicine | Admitting: Emergency Medicine

## 2022-01-31 ENCOUNTER — Encounter (HOSPITAL_COMMUNITY): Payer: Self-pay | Admitting: Emergency Medicine

## 2022-01-31 ENCOUNTER — Other Ambulatory Visit: Payer: Self-pay

## 2022-01-31 DIAGNOSIS — J45909 Unspecified asthma, uncomplicated: Secondary | ICD-10-CM | POA: Insufficient documentation

## 2022-01-31 DIAGNOSIS — M25572 Pain in left ankle and joints of left foot: Secondary | ICD-10-CM | POA: Insufficient documentation

## 2022-01-31 DIAGNOSIS — I10 Essential (primary) hypertension: Secondary | ICD-10-CM | POA: Insufficient documentation

## 2022-01-31 DIAGNOSIS — M79672 Pain in left foot: Secondary | ICD-10-CM | POA: Insufficient documentation

## 2022-01-31 MED ORDER — KETOROLAC TROMETHAMINE 30 MG/ML IJ SOLN: 30.0000 mg | Freq: Once | INTRAMUSCULAR | Status: DC

## 2022-01-31 NOTE — ED Provider Notes (Signed)
[facility] EMERGENCY DEPARTMENT Provider Note   CSN: [id number] Arrival date & time: 01/31/22  1807     History  Chief Complaint  Patient presents with   Foot Pain    [name] is a 36 y.o. female.  HPI     This is a 36 year old female who presents with left ankle and foot pain.  Patient reports that she woke up this morning and had difficulty bearing weight on the left foot.  She reports she has had pain and swelling of the left ankle for 2 days.  No history of arthritis or gout.  She is a daily drinker.  She states she was unable to follow through with her daily activities secondary to pain.  She reports that she works on her feet.  She has not had any fevers or other systemic symptoms.  Denies numbness or tingling.  Home Medications Prior to Admission medications   Medication Sig Start Date End Date Taking? Authorizing Provider  amoxicillin-clavulanate (AUGMENTIN) 875-125 MG tablet Take 1 tablet by mouth every 12 (twelve) hours. 03/10/21   Haskel Schroeder, PA-C  losartan (COZAAR) 50 MG tablet Take 1 tablet (50 mg total) by mouth daily. 11/18/20   Jacquelin Hawking, PA-C      Allergies    Patient has no known allergies.    Review of Systems   Review of Systems  Musculoskeletal:        Foot and ankle pain  All other systems reviewed and are negative.   Physical Exam Updated Vital Signs BP (!) 177/107 (BP Location: Right Arm)   Pulse 67   Temp 98.2 F (36.8 C) (Oral)   Resp 18   Ht 1.702 m (5\' 7" )   Wt 127 kg   LMP 01/26/2022   SpO2 98%   BMI 43.85 kg/m  Physical Exam Vitals and nursing note reviewed.  Constitutional:      Appearance: She is well-developed. She is obese. She is not ill-appearing.  HENT:     Head: Normocephalic and atraumatic.     Mouth/Throat:     Mouth: Mucous membranes are moist.  Eyes:     Pupils: Pupils are equal, round, and reactive to light.  Cardiovascular:     Rate and Rhythm: Normal rate and regular rhythm.     Heart  sounds: Normal heart sounds.  Pulmonary:     Effort: Pulmonary effort is normal. No respiratory distress.  Abdominal:     Palpations: Abdomen is soft.  Musculoskeletal:     Cervical back: Neck supple.     Comments: Tenderness palpation over the lateral malleolus, no overlying skin changes, slight swelling noted, normal range of motion without significant pain, 2+ DP pulse  Skin:    General: Skin is warm and dry.  Neurological:     Mental Status: She is alert and oriented to person, place, and time.  Psychiatric:        Mood and Affect: Mood normal.     ED Results / Procedures / Treatments   Labs (all labs ordered are listed, but only abnormal results are displayed) Labs Reviewed - No data to display  EKG None  Radiology DG Foot Complete Left  Result Date: 01/31/2022 CLINICAL DATA:  Foot swelling EXAM: LEFT FOOT - COMPLETE 3+ VIEW COMPARISON:  None Available. FINDINGS: No fracture or dislocation is seen. The joint spaces are preserved. The visualized soft tissues are unremarkable. IMPRESSION: Negative. Electronically Signed   By: 02/02/2022 M.D.   On: 01/31/2022  19:12    Procedures Procedures    Medications Ordered in ED Medications  ketorolac (TORADOL) 30 MG/ML injection 30 mg (has no administration in time range)    ED Course/ Medical Decision Making/ A&P                           Medical Decision Making Amount and/or Complexity of Data Reviewed Radiology: ordered.  Risk Prescription drug management.   This patient presents to the ED for concern of left foot and ankle pain, this involves an extensive number of treatment options, and is a complaint that carries with it a high risk of complications and morbidity.  I considered the following differential and admission for this acute, potentially life threatening condition.  The differential diagnosis includes injury including sprain or fracture although no traumatic history was given, arthritis, gouty arthritis,  less likely septic joint  MDM:    This is a 36 year old female who presents with atraumatic left ankle pain.  She is nontoxic and vital signs are reassuring.  She is afebrile.  No signs or symptoms of infection.  She has normal range of motion.  She does have slight tenderness and swelling over the lateral ankle.  She denies any trauma.  X-rays are negative for acute fracture.  Given her drinking history, question gouty arthritis.  Patient was given Toradol.  Prior to recheck, patient stated that she had to leave.  She was noted ambulating down the hall with a limp.    (Labs, imaging, consults)  Labs: I Ordered, and personally interpreted labs.  The pertinent results include: None  Imaging Studies ordered: I ordered imaging studies including x-rays left foot I independently visualized and interpreted imaging. I agree with the radiologist interpretation  Additional history obtained from chart review.  External records from outside source obtained and reviewed including prior evaluations  Cardiac Monitoring: The patient was maintained on a cardiac monitor.  I personally viewed and interpreted the cardiac monitored which showed an underlying rhythm of: Normal sinus rhythm  Reevaluation: After the interventions noted above, I reevaluated the patient and found that they have :stayed the same  Social Determinants of Health: Lives independently  Disposition: Discharge  Co morbidities that complicate the patient evaluation  Past Medical History:  Diagnosis Date   Asthma    Hypertension    Seasonal allergies    UTI (lower urinary tract infection)      Medicines Meds ordered this encounter  Medications   ketorolac (TORADOL) 30 MG/ML injection 30 mg    I have reviewed the patients home medicines and have made adjustments as needed  Problem List / ED Course: Problem List Items Addressed This Visit   None Visit Diagnoses     Acute left ankle pain    -  Primary                    Final Clinical Impression(s) / ED Diagnoses Final diagnoses:  Acute left ankle pain    Rx / DC Orders ED Discharge Orders     None         Shon Baton, MD 01/31/22 2343

## 2022-01-31 NOTE — ED Notes (Signed)
Pt walked to nurses station stating that she is leaving b/c "ive been here since 4pm, I've got to go" Pt signed out [facility]

## 2022-01-31 NOTE — ED Triage Notes (Signed)
Pt presents with left foot and ankle swelling x 2 days, denies injury, works on feet for long periods.

## 2022-01-31 NOTE — ED Notes (Addendum)
Pt states woke up and was difficult to walk on left foot. Pain starts lateral left foot and radiates to left, lateral distal calf. No swelling or new bruising notes. Pt states that callus and what looks like bruising to left lateral foot has been there. Denies any injuries to extremity

## 2022-04-17 ENCOUNTER — Encounter (HOSPITAL_COMMUNITY): Payer: Self-pay

## 2022-04-17 ENCOUNTER — Emergency Department (HOSPITAL_COMMUNITY): Payer: Self-pay

## 2022-04-17 ENCOUNTER — Other Ambulatory Visit: Payer: Self-pay

## 2022-04-17 ENCOUNTER — Emergency Department (HOSPITAL_COMMUNITY)
Admission: EM | Admit: 2022-04-17 | Discharge: 2022-04-17 | Disposition: A | Discharge status: Home / Self Care | Payer: Self-pay | Attending: Emergency Medicine | Admitting: Emergency Medicine

## 2022-04-17 DIAGNOSIS — M791 Myalgia, unspecified site: Secondary | ICD-10-CM | POA: Insufficient documentation

## 2022-04-17 DIAGNOSIS — R059 Cough, unspecified: Secondary | ICD-10-CM | POA: Insufficient documentation

## 2022-04-17 DIAGNOSIS — R030 Elevated blood-pressure reading, without diagnosis of hypertension: Secondary | ICD-10-CM

## 2022-04-17 DIAGNOSIS — R5383 Other fatigue: Secondary | ICD-10-CM | POA: Insufficient documentation

## 2022-04-17 DIAGNOSIS — R0789 Other chest pain: Secondary | ICD-10-CM | POA: Insufficient documentation

## 2022-04-17 DIAGNOSIS — N9489 Other specified conditions associated with female genital organs and menstrual cycle: Secondary | ICD-10-CM | POA: Insufficient documentation

## 2022-04-17 DIAGNOSIS — Z20822 Contact with and (suspected) exposure to covid-19: Secondary | ICD-10-CM | POA: Insufficient documentation

## 2022-04-17 DIAGNOSIS — M25541 Pain in joints of right hand: Secondary | ICD-10-CM

## 2022-04-17 DIAGNOSIS — R079 Chest pain, unspecified: Secondary | ICD-10-CM

## 2022-04-17 LAB — COMPREHENSIVE METABOLIC PANEL
ALT: 26 U/L (ref 0–44)
AST: 32 U/L (ref 15–41)
Albumin: 3.5 g/dL (ref 3.5–5.0)
Alkaline Phosphatase: 84 U/L (ref 38–126)
Anion gap: 6 (ref 5–15)
BUN: 12 mg/dL (ref 6–20)
CO2: 23 mmol/L (ref 22–32)
Calcium: 9.1 mg/dL (ref 8.9–10.3)
Chloride: 109 mmol/L (ref 98–111)
Creatinine, Ser: 0.88 mg/dL (ref 0.44–1.00)
GFR, Estimated: >60 mL/min (ref >60)
Glucose, Bld: 116 mg/dL — ABNORMAL HIGH (ref 70–99)
Potassium: 3.8 mmol/L (ref 3.5–5.1)
Sodium: 138 mmol/L (ref 135–145)
Total Bilirubin: 0.3 mg/dL (ref 0.3–1.2)
Total Protein: 7.6 g/dL (ref 6.5–8.1)

## 2022-04-17 LAB — CBC WITH DIFFERENTIAL/PLATELET
Abs Immature Granulocytes: 0.03 10*3/uL (ref 0.00–0.07)
Basophils Absolute: 0 10*3/uL (ref 0.0–0.1)
Basophils Relative: 0 %
Eosinophils Absolute: 0.1 10*3/uL (ref 0.0–0.5)
Eosinophils Relative: 1 %
HCT: 40.4 % (ref 36.0–46.0)
Hemoglobin: 12.9 g/dL (ref 12.0–15.0)
Immature Granulocytes: 0 %
Lymphocytes Relative: 17 %
Lymphs Abs: 1.7 10*3/uL (ref 0.7–4.0)
MCH: 27.3 pg (ref 26.0–34.0)
MCHC: 31.9 g/dL (ref 30.0–36.0)
MCV: 85.6 fL (ref 80.0–100.0)
Monocytes Absolute: 0.5 10*3/uL (ref 0.1–1.0)
Monocytes Relative: 5 %
Neutro Abs: 8.1 10*3/uL — ABNORMAL HIGH (ref 1.7–7.7)
Neutrophils Relative %: 77 %
Platelets: 203 10*3/uL (ref 150–400)
RBC: 4.72 MIL/uL (ref 3.87–5.11)
RDW: 15 % (ref 11.5–15.5)
WBC: 10.5 10*3/uL (ref 4.0–10.5)
nRBC: 0 % (ref 0.0–0.2)

## 2022-04-17 LAB — TROPONIN I (HIGH SENSITIVITY): Troponin I (High Sensitivity): 4 ng/L (ref <18)

## 2022-04-17 LAB — SEDIMENTATION RATE: Sed Rate: 32 mm/hr — ABNORMAL HIGH (ref 0–22)

## 2022-04-17 LAB — HCG, SERUM, QUALITATIVE: Preg, Serum: NEGATIVE

## 2022-04-17 LAB — SARS CORONAVIRUS 2 BY RT PCR: SARS Coronavirus 2 by RT PCR: NEGATIVE

## 2022-04-17 MED ORDER — KETOROLAC TROMETHAMINE 30 MG/ML IJ SOLN
30.0000 mg | Freq: Once | INTRAMUSCULAR | Status: AC
  Administered 2022-04-17: 30 mg via INTRAVENOUS
  Filled 2022-04-17: qty 1

## 2022-04-17 MED ORDER — NAPROXEN 250 MG PO TABS
500.0000 mg | ORAL_TABLET | Freq: Once | ORAL | Status: AC
  Administered 2022-04-17: 500 mg via ORAL
  Filled 2022-04-17: qty 2

## 2022-04-17 MED ORDER — SODIUM CHLORIDE 0.9 % IV BOLUS
1000.0000 mL | Freq: Once | INTRAVENOUS | Status: AC
  Administered 2022-04-17: 1000 mL via INTRAVENOUS

## 2022-04-17 MED ORDER — NAPROXEN 500 MG PO TABS: 500.0000 mg | ORAL_TABLET | Freq: Two times a day (BID) | ORAL | 0 refills | Status: AC

## 2022-04-17 NOTE — Discharge Instructions (Addendum)
Please call make an appointment with a rheumatologist in the next 7 to 10 days if joint pain continues despite naproxen use.  Please establish care with a primary care physician or call the wellness center in [facility] for reevaluation for elevated blood pressure reading and hemoglobin A1c check to evaluate for diabetes.

## 2022-04-17 NOTE — ED Notes (Signed)
EDP aware of high BP prior to d/c.

## 2022-04-17 NOTE — ED Notes (Signed)
ED Provider at bedside. 

## 2022-04-17 NOTE — ED Triage Notes (Signed)
Pt arrived via [facility] from home c/o generalized joint pain spreading "all over". Pt now endorsing sternal CP. Pt reports working on her feet a lot at [employer], and feels she may have been over-exerting herself. Pt ambulatory, A+O X 4.

## 2022-04-17 NOTE — ED Provider Notes (Addendum)
7:00 AM Patient signed out to me by previous ED physician. Pt is a 36 yo female presenting for generalized body aches. BP on arrival 220/108 with otherwise stable vitals. BP improved spontaneously after resting to 178/97.   Labs pending   Physical Exam  BP (!) 178/97   Pulse 81   Temp 98.7 F (37.1 C) (Oral)   Resp (!) 24   Ht 5\' 7"  (1.702 m)   Wt 133.8 kg   LMP 03/29/2022 (Exact Date)   SpO2 98%   BMI 46.20 kg/m   Physical Exam Vitals and nursing note reviewed.  Constitutional:      Appearance: Normal appearance.  Cardiovascular:     Rate and Rhythm: Normal rate and regular rhythm.  Pulmonary:     Effort: Pulmonary effort is normal.     Breath sounds: Normal breath sounds.  Musculoskeletal:     Right hand: Tenderness present. No swelling, deformity or lacerations. Normal range of motion. Normal pulse.     Left hand: Tenderness present. No swelling, deformity or lacerations. Normal range of motion. Normal pulse.  Skin:    Capillary Refill: Capillary refill takes less than 2 seconds.  Neurological:     General: No focal deficit present.     Mental Status: She is alert and oriented to person, place, and time.     Procedures  Procedures  ED Course / MDM    Medical Decision Making Amount and/or Complexity of Data Reviewed Labs: ordered. Radiology: ordered.  Risk Prescription drug management.   Patient is well appearing, non-toxic, afebrile, with stable vitals.   I independently interpreted patient's labs and EKG. Labs demonstrates stable electrolytes, glucose, and renal function. Stable hemoglobin. Pt endorses bilateral joint pain in the hands and fingers. No gross deformities on exam. No evidence of cellulitis or septic arthritis on physical exam. No hx of fall or injuries. No wounds. Sed rate is minimally elevated. Will give f/u contact information for rheumatology if pain does not improve with naproxen use and rest.   Pt also endorsed chest pain. Pt has normal  rate and rhythm. Stable ECG with no acute abnormalities. Clear breath sounds. CXR demonstrates no pneumothorax, no pneumonia, no acute abnormalities. Troponin within normal limits. Pt is PERC negative. PE considered but low suspicion. Patient heart score low 0-3. No further workup is recommended at this time.   Patient in no distress and overall condition improved here in the ED. Detailed discussions were had with the patient regarding current findings, and need for close f/u with PCP or on call doctor. The patient has been instructed to return immediately if the symptoms worsen in any way for re-evaluation. Patient verbalized understanding and is in agreement with current care plan. All questions answered prior to discharge.    Discharge instructions: Please call make an appointment with a rheumatologist in the next 7 to 10 days if joint pain continues despite naproxen use.  Please establish care with a primary care physician or call the wellness center in [facility] for reevaluation for elevated blood pressure reading and hemoglobin A1c check to evaluate for diabetes.  Return to ED as needed.    Lianne Cure, DO 01/08/15 0109    Lianne Cure, DO 32/35/57 [phone]

## 2022-04-17 NOTE — ED Provider Notes (Signed)
[facility] EMERGENCY DEPARTMENT Provider Note   CSN: [id number] Arrival date & time: 04/17/22  0540     History {Add pertinent medical, surgical, social history, OB history to HPI:1} Chief Complaint  Patient presents with   Generalized Body Aches    [name] is a 36 y.o. female.  Patient is a 36 year old female with no significant past medical history.  Patient presenting today with complaints of body/joint aches, fatigue, cough, chest tightness, and feeling generally unwell for the past 2 days.  Today she found it difficult to walk because she was in discomfort.  She called 911, then was transported here.  She was hypertensive in transport.  She denies to me she is having fevers or chills.  She denies abdominal pain, diarrhea.  She denies any ill contacts.  There are no alleviating factors.  She believes she may have "overdone it" at work several days ago.  She works at [employer] and moving things all day.  The history is provided by the patient.       Home Medications Prior to Admission medications   Medication Sig Start Date End Date Taking? Authorizing Provider  amoxicillin-clavulanate (AUGMENTIN) 875-125 MG tablet Take 1 tablet by mouth every 12 (twelve) hours. 03/10/21   Loni Beckwith, PA-C  losartan (COZAAR) 50 MG tablet Take 1 tablet (50 mg total) by mouth daily. 11/18/20   Soyla Dryer, PA-C      Allergies    Patient has no known allergies.    Review of Systems   Review of Systems  All other systems reviewed and are negative.   Physical Exam Updated Vital Signs Ht 5\' 7"  (1.702 m)   Wt 133.8 kg   LMP 03/29/2022 (Exact Date)   BMI 46.20 kg/m  Physical Exam Vitals and nursing note reviewed.  Constitutional:      General: She is not in acute distress.    Appearance: She is well-developed. She is not diaphoretic.  HENT:     Head: Normocephalic and atraumatic.     Mouth/Throat:     Mouth: Mucous membranes are moist.      Pharynx: No oropharyngeal exudate.  Eyes:     Extraocular Movements: Extraocular movements intact.     Pupils: Pupils are equal, round, and reactive to light.  Cardiovascular:     Rate and Rhythm: Normal rate and regular rhythm.     Heart sounds: No murmur heard.    No friction rub. No gallop.  Pulmonary:     Effort: Pulmonary effort is normal. No respiratory distress.     Breath sounds: Normal breath sounds. No wheezing.  Abdominal:     General: Bowel sounds are normal. There is no distension.     Palpations: Abdomen is soft.     Tenderness: There is no abdominal tenderness.  Musculoskeletal:        General: Normal range of motion.     Cervical back: Normal range of motion and neck supple.  Skin:    General: Skin is warm and dry.  Neurological:     General: No focal deficit present.     Mental Status: She is alert and oriented to person, place, and time.     Cranial Nerves: No cranial nerve deficit.     Motor: No weakness.     ED Results / Procedures / Treatments   Labs (all labs ordered are listed, but only abnormal results are displayed) Labs Reviewed  SARS CORONAVIRUS 2 BY RT PCR  COMPREHENSIVE  METABOLIC PANEL  CBC WITH DIFFERENTIAL/PLATELET  SEDIMENTATION RATE  TROPONIN I (HIGH SENSITIVITY)    EKG EKG Interpretation  Date/Time:  Sunday April 17 2022 05:49:32 EDT Ventricular Rate:  92 PR Interval:  178 QRS Duration: 89 QT Interval:  359 QTC Calculation: 445 R Axis:   62 Text Interpretation: Sinus rhythm Left atrial enlargement Confirmed by Geoffery Lyons ([id number]) on 04/17/2022 5:54:18 AM  Radiology No results found.  Procedures Procedures  {Document cardiac monitor, telemetry assessment procedure when appropriate:1}  Medications Ordered in ED Medications  sodium chloride 0.9 % bolus 1,000 mL (has no administration in time range)  ketorolac (TORADOL) 30 MG/ML injection 30 mg (has no administration in time range)    ED Course/ Medical Decision Making/  A&P                           Medical Decision Making Amount and/or Complexity of Data Reviewed Labs: ordered. Radiology: ordered.  Risk Prescription drug management.   ***  {Document critical care time when appropriate:1} {Document review of labs and clinical decision tools ie heart score, Chads2Vasc2 etc:1}  {Document your independent review of radiology images, and any outside records:1} {Document your discussion with family members, caretakers, and with consultants:1} {Document social determinants of health affecting pt's care:1} {Document your decision making why or why not admission, treatments were needed:1} Final Clinical Impression(s) / ED Diagnoses Final diagnoses:  None    Rx / DC Orders ED Discharge Orders     None

## 2022-04-17 NOTE — ED Notes (Signed)
Pt reports taking 800mg  Ibuprofen PTA

## 2022-09-28 ENCOUNTER — Emergency Department (HOSPITAL_COMMUNITY)
Admission: EM | Admit: 2022-09-28 | Discharge: 2022-09-28 | Disposition: A | Discharge status: Home / Self Care | Payer: Managed Care, Other (non HMO) | Attending: Emergency Medicine | Admitting: Emergency Medicine

## 2022-09-28 ENCOUNTER — Emergency Department (HOSPITAL_COMMUNITY): Payer: Managed Care, Other (non HMO)

## 2022-09-28 ENCOUNTER — Ambulatory Visit: Payer: Self-pay

## 2022-09-28 ENCOUNTER — Other Ambulatory Visit: Payer: Self-pay

## 2022-09-28 DIAGNOSIS — I160 Hypertensive urgency: Secondary | ICD-10-CM | POA: Insufficient documentation

## 2022-09-28 DIAGNOSIS — Z79899 Other long term (current) drug therapy: Secondary | ICD-10-CM | POA: Insufficient documentation

## 2022-09-28 DIAGNOSIS — F17200 Nicotine dependence, unspecified, uncomplicated: Secondary | ICD-10-CM | POA: Diagnosis not present

## 2022-09-28 DIAGNOSIS — J111 Influenza due to unidentified influenza virus with other respiratory manifestations: Secondary | ICD-10-CM

## 2022-09-28 DIAGNOSIS — Z20822 Contact with and (suspected) exposure to covid-19: Secondary | ICD-10-CM | POA: Insufficient documentation

## 2022-09-28 DIAGNOSIS — J101 Influenza due to other identified influenza virus with other respiratory manifestations: Secondary | ICD-10-CM | POA: Insufficient documentation

## 2022-09-28 DIAGNOSIS — R059 Cough, unspecified: Secondary | ICD-10-CM | POA: Diagnosis present

## 2022-09-28 DIAGNOSIS — I10 Essential (primary) hypertension: Secondary | ICD-10-CM | POA: Diagnosis not present

## 2022-09-28 LAB — BASIC METABOLIC PANEL
Anion gap: 8 (ref 5–15)
BUN: 11 mg/dL (ref 6–20)
CO2: 24 mmol/L (ref 22–32)
Calcium: 8.8 mg/dL — ABNORMAL LOW (ref 8.9–10.3)
Chloride: 102 mmol/L (ref 98–111)
Creatinine, Ser: 1.02 mg/dL — ABNORMAL HIGH (ref 0.44–1.00)
GFR, Estimated: >60 mL/min (ref >60)
Glucose, Bld: 107 mg/dL — ABNORMAL HIGH (ref 70–99)
Potassium: 3.3 mmol/L — ABNORMAL LOW (ref 3.5–5.1)
Sodium: 134 mmol/L — ABNORMAL LOW (ref 135–145)

## 2022-09-28 LAB — CBC
HCT: 40.8 % (ref 36.0–46.0)
Hemoglobin: 12.8 g/dL (ref 12.0–15.0)
MCH: 26.9 pg (ref 26.0–34.0)
MCHC: 31.4 g/dL (ref 30.0–36.0)
MCV: 85.7 fL (ref 80.0–100.0)
Platelets: 197 10*3/uL (ref 150–400)
RBC: 4.76 MIL/uL (ref 3.87–5.11)
RDW: 16.1 % — ABNORMAL HIGH (ref 11.5–15.5)
WBC: 3.8 10*3/uL — ABNORMAL LOW (ref 4.0–10.5)
nRBC: 0 % (ref 0.0–0.2)

## 2022-09-28 LAB — RESP PANEL BY RT-PCR (RSV, FLU A&B, COVID)  RVPGX2
Influenza A by PCR: POSITIVE — AB
Influenza B by PCR: NEGATIVE
Resp Syncytial Virus by PCR: NEGATIVE
SARS Coronavirus 2 by RT PCR: NEGATIVE

## 2022-09-28 LAB — TROPONIN I (HIGH SENSITIVITY): Troponin I (High Sensitivity): 10 ng/L (ref <18)

## 2022-09-28 MED ORDER — LOSARTAN POTASSIUM 50 MG PO TABS: 50.0000 mg | ORAL_TABLET | Freq: Every day | ORAL | 0 refills | Status: DC

## 2022-09-28 MED ORDER — LOSARTAN POTASSIUM 25 MG PO TABS
50.0000 mg | ORAL_TABLET | Freq: Once | ORAL | Status: AC
  Administered 2022-09-28: 50 mg via ORAL
  Filled 2022-09-28: qty 2

## 2022-09-28 NOTE — Discharge Instructions (Addendum)
You have the flu this is a viral infection that will likely start to improve after 7-10 days, antibiotics are not helpful in treating viral infections. Since your symptoms have been present for more than 2 days Tamiflu will give no additional benefit.  Please make sure you are drinking plenty of fluids. You can treat your symptoms supportively with tylenol 650 mg/1000mg and ibuprofen 600 mg every 6 hours for fevers and pains. For nasal congestion you can use Zyrtec and Flonase to help with nasal congestion. To treat cough you can use over the counter cough medications such as Mucinex DM or Robitussin and throat lozenges. If your symptoms are not improving please follow up with you Primary doctor.   If you develop persistent fevers, shortness of breath or difficulty breathing, chest pain, severe headache and neck pain, persistent nausea and vomiting or other new or concerning symptoms return to the Emergency department.  

## 2022-09-28 NOTE — ED Triage Notes (Signed)
Pt c/o cough, rib pain, nasal congestion , yellow discharge from drainage, sneezing, itchy throat, watery eyes, and afebrile x 1 week.

## 2022-09-28 NOTE — ED Provider Notes (Signed)
[facility] Provider Note   CSN: [id number] Arrival date & time: 09/28/22  [id number]     History  Chief Complaint  Patient presents with   Cough    [name] is a 37 y.o. female with past medical history significant for tobacco use, obesity, anxiety, hypertension who presents with cough, rib/chest pain she thinks is from coughing, nasal congestion, yellow discharge, sneezing, itchy throat, watery eyes.  Patient reports that she also has allergies, she has not been taking any allergy medication.  Patient denies any chest pain worse with exertion, denies any shortness of breath.  She reports no fevers at home, she reports that her boyfriend was recently seen and evaluated by [place] and tested positive for the flu at that time.  Patient reports that she had not been able to afford her blood pressure medication so she has not been taking it recently.   Cough      Home Medications Prior to Admission medications   Medication Sig Start Date End Date Taking? Authorizing Provider  amoxicillin-clavulanate (AUGMENTIN) 875-125 MG tablet Take 1 tablet by mouth every 12 (twelve) hours. Patient not taking: Reported on 09/28/2022 03/10/21   Loni Beckwith, PA-C  losartan (COZAAR) 50 MG tablet Take 1 tablet (50 mg total) by mouth daily. 09/28/22   Annabell Oconnor H, PA-C  naproxen (NAPROSYN) 500 MG tablet Take 1 tablet (500 mg total) by mouth 2 (two) times daily. Patient not taking: Reported on 09/28/2022 [id number]   Campbell Stall P, DO      Allergies    Patient has no known allergies.    Review of Systems   Review of Systems  Respiratory:  Positive for cough.   All other systems reviewed and are negative.   Physical Exam Updated Vital Signs BP (!) 214/135   Pulse 82   Temp 98.1 F (36.7 C) (Oral)   Resp 18   Ht 5\' 7"  (1.702 m)   Wt 131.7 kg   LMP 09/20/2022   SpO2 99%   BMI 45.48 kg/m  Physical Exam Vitals and nursing note reviewed.   Constitutional:      General: She is not in acute distress.    Appearance: Normal appearance.  HENT:     Head: Normocephalic and atraumatic.  Eyes:     General:        Right eye: No discharge.        Left eye: No discharge.  Cardiovascular:     Rate and Rhythm: Normal rate and regular rhythm.  Pulmonary:     Effort: Pulmonary effort is normal. No respiratory distress.     Comments: Scattered Rhonchi but without wheezing, stridor, focal consolidation on my exam Musculoskeletal:        General: No deformity.  Skin:    General: Skin is warm and dry.  Neurological:     Mental Status: She is alert and oriented to person, place, and time.  Psychiatric:        Mood and Affect: Mood normal.        Behavior: Behavior normal.     ED Results / Procedures / Treatments   Labs (all labs ordered are listed, but only abnormal results are displayed) Labs Reviewed  RESP PANEL BY RT-PCR (RSV, FLU A&B, COVID)  RVPGX2 - Abnormal; Notable for the following components:      Result Value   Influenza A by PCR POSITIVE (*)    All other components within normal limits  CBC - Abnormal; Notable for the following components:   WBC 3.8 (*)    RDW 16.1 (*)    All other components within normal limits  BASIC METABOLIC PANEL - Abnormal; Notable for the following components:   Sodium 134 (*)    Potassium 3.3 (*)    Glucose, Bld 107 (*)    Creatinine, Ser 1.02 (*)    Calcium 8.8 (*)    All other components within normal limits  TROPONIN I (HIGH SENSITIVITY)    EKG EKG Interpretation  Date/Time:  Wednesday September 28 2022 11:14:04 EDT Ventricular Rate:  73 PR Interval:  178 QRS Duration: 88 QT Interval:  407 QTC Calculation: 449 R Axis:   53 Text Interpretation: Sinus rhythm Probable left atrial enlargement Probable left ventricular hypertrophy Borderline ST elevation, lateral leads No significant change since last tracing Confirmed by Dorie Rank [phone]) on 09/28/2022 11:21:07 AM  Radiology DG  Chest 2 View  Result Date: 09/28/2022 CLINICAL DATA:  Cough. EXAM: CHEST - 2 VIEW COMPARISON:  Chest radiograph dated April 17, 2022 FINDINGS: The heart size and mediastinal contours are within normal limits. Both lungs are clear. The visualized skeletal structures are unremarkable. IMPRESSION: No active cardiopulmonary disease. Electronically Signed   By: Keane Police D.O.   On: 09/28/2022 11:15    Procedures Procedures    Medications Ordered in ED Medications  losartan (COZAAR) tablet 50 mg (50 mg Oral Given 09/28/22 1117)    ED Course/ Medical Decision Making/ A&P                             Medical Decision Making Amount and/or Complexity of Data Reviewed Labs: ordered. Radiology: ordered.  Risk Prescription drug management.   This patient is a 37 y.o. female  who presents to the ED for concern of cough, rib pain, nasal congestion, yellow discharge, sneezing, itchy, watery eyes.   Differential diagnoses prior to evaluation: The emergent differential diagnosis includes, but is not limited to, upper respiratory infection including flu, COVID, RSV, developing pneumonia, asthma, COPD, CHF, patient additionally arrives quite hypertensive, blood pressure 214/135 considered hypertensive urgency versus emergency. This is not an exhaustive differential.   Past Medical History / Co-morbidities: Obesity, hypertension  Additional history: Chart reviewed. Pertinent results include: Reviewed lab work, imaging from previous emergency department evaluations  Physical Exam: Physical exam performed. The pertinent findings include: Patient has some scattered rhonchi, congestion but no acute respiratory distress, she is stable oxygen saturation on room air.  She is afebrile.  Her blood pressure is significantly elevated on initial presentation, blood pressure 214/135.  Lab Tests/Imaging studies: I personally interpreted labs/imaging and the pertinent results include: Troponin x 1 at 10 today  in context of no active chest pain, BMP notable for creatinine of 1.02, not significantly elevated, sodium 134, potassium 3.3, mild hyponatremia and hypokalemia, encourage PCP recheck.  CBC is overall unremarkable, slight leukocytopenia may be secondary to viral illness.  RVP is positive for flu A.  Raquel Sarna interpreted plain film chest x-ray which shows no acute intrathoracic abnormality.  I agree with the radiologist interpretation.  Cardiac monitoring: EKG obtained and interpreted by my attending physician which shows: Normal sinus rhythm, borderline ST elevation in lateral leads, but no significant change from last baseline.   Medications: I ordered medication including patient's home losartan to help with blood pressure.  I have reviewed the patients home medicines and have made adjustments as needed.   Disposition: After  consideration of the diagnostic results and the patients response to treatment, I feel that patient blood pressure is somewhat improved on reevaluation, systolic [id number] over diastolic [id number].  Still quite hypertensive, I encouraged her to begin taking her home blood pressure medication again and to follow-up closely with her primary care doctor, she is chest pain-free, with no signs of endorgan damage at this time I think that she is stable for discharge.  Her symptoms are consistent with the flu, she has had symptoms for 2 many days to be candidate for Tamiflu, she is not having nausea, does not require Zofran.  Encouraged fluids, rest.   emergency department workup does not suggest an emergent condition requiring admission or immediate intervention beyond what has been performed at this time. The plan is: as above. The patient is safe for discharge and has been instructed to return immediately for worsening symptoms, change in symptoms or any other concerns.  Final Clinical Impression(s) / ED Diagnoses Final diagnoses:  Flu  Hypertensive urgency    Rx / DC Orders ED Discharge  Orders          Ordered    losartan (COZAAR) 50 MG tablet  Daily        09/28/22 1225              Dorien Chihuahua 09/28/22 1229    Dorie Rank, MD 09/29/22 1944

## 2022-12-22 ENCOUNTER — Encounter (HOSPITAL_COMMUNITY): Payer: Self-pay | Admitting: Emergency Medicine

## 2022-12-22 ENCOUNTER — Emergency Department (HOSPITAL_COMMUNITY): Payer: Managed Care, Other (non HMO)

## 2022-12-22 ENCOUNTER — Other Ambulatory Visit: Payer: Self-pay

## 2022-12-22 ENCOUNTER — Emergency Department (HOSPITAL_COMMUNITY)
Admission: EM | Admit: 2022-12-22 | Discharge: 2022-12-22 | Disposition: A | Discharge status: Home / Self Care | Payer: Managed Care, Other (non HMO) | Attending: Emergency Medicine | Admitting: Emergency Medicine

## 2022-12-22 DIAGNOSIS — R519 Headache, unspecified: Secondary | ICD-10-CM

## 2022-12-22 DIAGNOSIS — Z79899 Other long term (current) drug therapy: Secondary | ICD-10-CM | POA: Diagnosis not present

## 2022-12-22 DIAGNOSIS — R200 Anesthesia of skin: Secondary | ICD-10-CM | POA: Diagnosis not present

## 2022-12-22 DIAGNOSIS — I10 Essential (primary) hypertension: Secondary | ICD-10-CM | POA: Insufficient documentation

## 2022-12-22 LAB — URINALYSIS, ROUTINE W REFLEX MICROSCOPIC
Bilirubin Urine: NEGATIVE
Glucose, UA: NEGATIVE mg/dL
Ketones, ur: NEGATIVE mg/dL
Leukocytes,Ua: NEGATIVE
Nitrite: NEGATIVE
Protein, ur: 100 mg/dL — AB
RBC / HPF: >50 RBC/hpf (ref 0–5)
Specific Gravity, Urine: 1.027 (ref 1.005–1.030)
pH: 5 (ref 5.0–8.0)

## 2022-12-22 LAB — CBC
HCT: 41.7 % (ref 36.0–46.0)
Hemoglobin: 12.9 g/dL (ref 12.0–15.0)
MCH: 26.4 pg (ref 26.0–34.0)
MCHC: 30.9 g/dL (ref 30.0–36.0)
MCV: 85.5 fL (ref 80.0–100.0)
Platelets: 240 10*3/uL (ref 150–400)
RBC: 4.88 MIL/uL (ref 3.87–5.11)
RDW: 15.9 % — ABNORMAL HIGH (ref 11.5–15.5)
WBC: 8.9 10*3/uL (ref 4.0–10.5)
nRBC: 0 % (ref 0.0–0.2)

## 2022-12-22 LAB — BASIC METABOLIC PANEL
Anion gap: 9 (ref 5–15)
BUN: 8 mg/dL (ref 6–20)
CO2: 22 mmol/L (ref 22–32)
Calcium: 8.8 mg/dL — ABNORMAL LOW (ref 8.9–10.3)
Chloride: 106 mmol/L (ref 98–111)
Creatinine, Ser: 1.04 mg/dL — ABNORMAL HIGH (ref 0.44–1.00)
GFR, Estimated: >60 mL/min (ref >60)
Glucose, Bld: 146 mg/dL — ABNORMAL HIGH (ref 70–99)
Potassium: 3.2 mmol/L — ABNORMAL LOW (ref 3.5–5.1)
Sodium: 137 mmol/L (ref 135–145)

## 2022-12-22 LAB — MAGNESIUM: Magnesium: 1.9 mg/dL (ref 1.7–2.4)

## 2022-12-22 LAB — PREGNANCY, URINE: Preg Test, Ur: NEGATIVE

## 2022-12-22 MED ORDER — DIPHENHYDRAMINE HCL 50 MG/ML IJ SOLN
25.0000 mg | Freq: Once | INTRAMUSCULAR | Status: AC
  Administered 2022-12-22: 25 mg via INTRAVENOUS
  Filled 2022-12-22: qty 1

## 2022-12-22 MED ORDER — POTASSIUM CHLORIDE CRYS ER 20 MEQ PO TBCR
20.0000 meq | EXTENDED_RELEASE_TABLET | Freq: Once | ORAL | Status: AC
  Administered 2022-12-22: 20 meq via ORAL
  Filled 2022-12-22: qty 1

## 2022-12-22 MED ORDER — LOSARTAN POTASSIUM 50 MG PO TABS: 50.0000 mg | ORAL_TABLET | Freq: Every day | ORAL | 0 refills | Status: AC

## 2022-12-22 MED ORDER — PROCHLORPERAZINE EDISYLATE 10 MG/2ML IJ SOLN
10.0000 mg | Freq: Once | INTRAMUSCULAR | Status: AC
  Administered 2022-12-22: 10 mg via INTRAVENOUS
  Filled 2022-12-22: qty 2

## 2022-12-22 NOTE — Discharge Instructions (Signed)
It was a pleasure taking care of you today. We evaluated you for headache and right facial numbness.  Your CT scan was reassuring as well as your blood work.  Of note your blood pressure was high today, since you did not take your blood pressure medication at home make sure you take it today when you get back home.  Your symptoms all resolved with medication for your headache, this may have represented something called a complicated migraine.  Follow-up closely with your primary care doctor to recheck your blood pressure and follow-up from today's visit if you have any new or worsening symptoms, come back to the ER right away.

## 2022-12-22 NOTE — ED Notes (Signed)
Messaged provider on BP and desire to treat.

## 2022-12-22 NOTE — ED Notes (Signed)
Crackers and water given to pt.  

## 2022-12-22 NOTE — ED Notes (Signed)
Addressed and introduced patient. Lab is at bedside.

## 2022-12-22 NOTE — ED Notes (Signed)
Meds delayed due to inability to obtain IV access. PA to attempt [place] IV

## 2022-12-22 NOTE — ED Provider Notes (Addendum)
[place] [facility] AT [facility] Provider Note   CSN: [id number] Arrival date & time: 12/22/22  1405     History  Chief Complaint  Patient presents with   Headache   Numbness    ARSHI CHUC is a 37 y.o. female.  She has PMH of hypertension on lisinopril that she did not take this today.  Presents the ER today complaining of headache that started around 7 PM last night is gradually worse, 7 out of 10 currently, slightly worse earlier today after Tylenol.   She is concerned because she is also having some right-sided facial numbness, no weakness, no slurred speech, no extremity numbness tingling or weakness of her head to the past, no history of headaches.  No recent injury or trauma, no fevers, no neck pain, she is not on blood thinners.  No history of cancer,   Headache      Home Medications Prior to Admission medications   Medication Sig Start Date End Date Taking? Authorizing Provider  amoxicillin-clavulanate (AUGMENTIN) 875-125 MG tablet Take 1 tablet by mouth every 12 (twelve) hours. Patient not taking: Reported on 09/28/2022 03/10/21   Haskel Schroeder, PA-C  losartan (COZAAR) 50 MG tablet Take 1 tablet (50 mg total) by mouth daily. 12/22/22   Carmel Sacramento A, PA-C  naproxen (NAPROSYN) 500 MG tablet Take 1 tablet (500 mg total) by mouth 2 (two) times daily. Patient not taking: Reported on 09/28/2022 04/17/22   Edwin Dada P, DO      Allergies    Patient has no known allergies.    Review of Systems   Review of Systems  Neurological:  Positive for headaches.    Physical Exam Updated Vital Signs BP (!) 156/96 (BP Location: Left Arm)   Pulse 83   Temp 98.3 F (36.8 C) (Oral)   Resp 17   Ht 5\' 7"  (1.702 m)   Wt 131.5 kg   LMP 12/18/2022 (Exact Date)   SpO2 98%   BMI 45.42 kg/m  Physical Exam Vitals and nursing note reviewed.  Constitutional:      General: She is not in acute distress.    Appearance: She is well-developed.   HENT:     Head: Normocephalic and atraumatic.  Eyes:     Conjunctiva/sclera: Conjunctivae normal.  Cardiovascular:     Rate and Rhythm: Normal rate and regular rhythm.     Heart sounds: No murmur heard. Pulmonary:     Effort: Pulmonary effort is normal. No respiratory distress.     Breath sounds: Normal breath sounds.  Abdominal:     Palpations: Abdomen is soft.     Tenderness: There is no abdominal tenderness.  Musculoskeletal:        General: No swelling.     Cervical back: Neck supple.  Skin:    General: Skin is warm and dry.     Capillary Refill: Capillary refill takes less than 2 seconds.  Neurological:     Mental Status: She is alert and oriented to person, place, and time.     GCS: GCS eye subscore is 4. GCS verbal subscore is 5. GCS motor subscore is 6.     Sensory: Sensory deficit present.     Motor: Motor function is intact. No weakness, abnormal muscle tone or pronator drift.     Coordination: Finger-Nose-Finger Test normal.     Gait: Gait is intact.     Comments: Decreased sensation mildly to right face  Psychiatric:  Mood and Affect: Mood normal.     ED Results / Procedures / Treatments   Labs (all labs ordered are listed, but only abnormal results are displayed) Labs Reviewed  BASIC METABOLIC PANEL - Abnormal; Notable for the following components:      Result Value   Potassium 3.2 (*)    Glucose, Bld 146 (*)    Creatinine, Ser 1.04 (*)    Calcium 8.8 (*)    All other components within normal limits  CBC - Abnormal; Notable for the following components:   RDW 15.9 (*)    All other components within normal limits  URINALYSIS, ROUTINE W REFLEX MICROSCOPIC - Abnormal; Notable for the following components:   APPearance HAZY (*)    Hgb urine dipstick LARGE (*)    Protein, ur 100 (*)    Bacteria, UA RARE (*)    All other components within normal limits  MAGNESIUM  PREGNANCY, URINE  POC URINE PREG, ED    EKG None  Radiology CT Head Wo  Contrast  Result Date: 12/22/2022 CLINICAL DATA:  Headache, increasing frequency or severity EXAM: CT HEAD WITHOUT CONTRAST TECHNIQUE: Contiguous axial images were obtained from the base of the skull through the vertex without intravenous contrast. RADIATION DOSE REDUCTION: This exam was performed according to the departmental dose-optimization program which includes automated exposure control, adjustment of the mA and/or kV according to patient size and/or use of iterative reconstruction technique. COMPARISON:  None Available. FINDINGS: Brain: No intracranial hemorrhage, mass effect, or midline shift. No hydrocephalus. Incidental cavum septum pellucidum, variant anatomy. The basilar cisterns are patent. Partially empty sella. No evidence of territorial infarct or acute ischemia. No extra-axial or intracranial fluid collection. Vascular: No hyperdense vessel or unexpected calcification. Skull: No fracture or focal lesion. Sinuses/Orbits: Partial opacification of left mastoid air cells. No paranasal sinus opacification. Unremarkable orbits. Other: None. IMPRESSION: 1. No acute intracranial abnormality. 2. Partially empty sella, nonspecific. This is typically incidental, but can be seen with idiopathic intracranial hypertension. 3. Partial opacification of left mastoid air cells. Electronically Signed   By: Narda Rutherford M.D.   On: 12/22/2022 17:50    Procedures Ultrasound ED Peripheral IV (Provider)  Date/Time: 12/22/2022 8:15 PM  Performed by: Ma Rings, PA-C Authorized by: Ma Rings, PA-C   Procedure details:    Indications: multiple failed IV attempts     Skin Prep: chlorhexidine gluconate     Location:  Left AC   Angiocath:  20 G   Bedside Ultrasound Guided: Yes     Images: not archived     Patient tolerated procedure without complications: Yes     Dressing applied: Yes       Medications Ordered in ED Medications  prochlorperazine (COMPAZINE) injection 10 mg (10 mg  Intravenous Given 12/22/22 1820)  diphenhydrAMINE (BENADRYL) injection 25 mg (25 mg Intravenous Given 12/22/22 1819)  potassium chloride SA (KLOR-CON M) CR tablet 20 mEq (20 mEq Oral Given 12/22/22 1928)    ED Course/ Medical Decision Making/ A&P                             Medical Decision Making This patient presents to the ED for concern of back and right facial numbness since 7 PM last night, this involves an extensive number of treatment options, and is a complaint that carries with it a high risk of complications and morbidity.  The differential diagnosis includes migraine, tension headache, Intracranial hemorrhage, cluster  headache, medication overuse headache, intracranial mass, temporal arteritis, CVA, complex migraine, other    Co morbidities that complicate the patient evaluation :   HTN   Additional history obtained:  Additional history obtained from EMR External records from outside source obtained and reviewed including multiple prior visits   Lab Tests:  I Ordered, and personally interpreted labs.  The pertinent results include: CBC, strain, mild hypokalemia which was repleted, magnesium normal, UA does have large blood likely due to current menses   Imaging Studies ordered:  I ordered imaging studies including CT head  I independently visualized and interpreted imaging which showed no acute intracranial abnormality I agree with the radiologist interpretation   Cardiac Monitoring: / EKG:  The patient was maintained on a cardiac monitor.  I personally viewed and interpreted the cardiac monitored which showed an underlying rhythm of: Normal sinus rhythm   Consultations Obtained:  I requested consultation with the ED attending, discussed lab and imaging finding, given resolution of symptoms with migraine cocktail including her facial numbness will have her follow-up closely with her PCP.   Problem List / ED Course / Critical interventions / Medication  management  Patient presents the ER with headache since around 7 PM last night that was gradual in onset, never was severe pain, associated with right-sided facial numbness.  Light decreased sensation to touch on exam otherwise no neurologic abnormalities on my exam.  CT ordered due to symptoms but shows no acute abnormalities, does show partially empty sella, likely idiopathic, patient has no blurry vision, no nausea or vomiting, discussed findings with her and to follow-up close with her PCP, discussed strict return precautions.  Agreeable plan of care and discharge.  Discussed this may have represented a complex migraine, she has no other abnormalities, she has history of hypertension, did not take her medications today, states she is going to get when she gets home.  I with your multiple prior charts, her blood pressure today is about her baseline chronically.  She states she has not established primary care yet and discussed the importance of this today.  She was given references for outpatient follow-up and given strict return precautions.  Based on her prior prescriptions she is likely nearly out of her blood pressure medication so this was represcribed today. I ordered medication including Compazine, Benadryl  for headache Reevaluation of the patient after these medicines showed that the patient resolved I have reviewed the patients home medicines and have made adjustments as needed      Amount and/or Complexity of Data Reviewed Labs: ordered. Radiology: ordered.  Risk Prescription drug management.           Final Clinical Impression(s) / ED Diagnoses Final diagnoses:  Bad headache  Numbness  Primary hypertension    Rx / DC Orders ED Discharge Orders          Ordered    losartan (COZAAR) 50 MG tablet  Daily        12/22/22 1958              Ma Rings, PA-C 12/22/22 1959    Josem Kaufmann 12/22/22 2015    Terrilee Files,  MD 12/23/22 1036

## 2022-12-22 NOTE — ED Triage Notes (Addendum)
Pt c/o posterior headache since last night and right-sided facial numbness since earlier today. Recently started HTN meds and is med compliant but BP remains high per pt report. BP 172/115 noted in triage. No facial droop noted and pt has full control of facial muscles. NIHSS 1 for facial numbness. Pt also notes high stress job and is requesting mental health evaluation.
